# Patient Record
Sex: Male | Born: 1935 | Race: White | Hispanic: No | Smoking: Never smoker
Health system: Southern US, Community
[De-identification: ages and names within clinical notes are randomized; demographics above are authoritative.]

## PROBLEM LIST (undated history)

## (undated) DIAGNOSIS — E785 Hyperlipidemia, unspecified: Secondary | ICD-10-CM

## (undated) DIAGNOSIS — J449 Chronic obstructive pulmonary disease, unspecified: Secondary | ICD-10-CM

## (undated) DIAGNOSIS — I10 Essential (primary) hypertension: Secondary | ICD-10-CM

## (undated) DIAGNOSIS — C801 Malignant (primary) neoplasm, unspecified: Secondary | ICD-10-CM

## (undated) DIAGNOSIS — I2510 Atherosclerotic heart disease of native coronary artery without angina pectoris: Secondary | ICD-10-CM

## (undated) HISTORY — PX: CHOLECYSTECTOMY: SHX55

## (undated) HISTORY — PX: TONSILLECTOMY: SUR1361

## (undated) HISTORY — PX: CORONARY ARTERY BYPASS GRAFT: SHX141

## (undated) HISTORY — PX: CORONARY ANGIOPLASTY: SHX604

## (undated) HISTORY — PX: COLONOSCOPY: SHX174

---

## 2004-11-27 ENCOUNTER — Ambulatory Visit: Payer: Self-pay | Admitting: Cardiology

## 2006-07-05 ENCOUNTER — Emergency Department: Payer: Self-pay | Admitting: General Practice

## 2006-10-20 ENCOUNTER — Ambulatory Visit: Payer: Self-pay | Admitting: Urology

## 2006-10-20 ENCOUNTER — Other Ambulatory Visit: Payer: Self-pay

## 2006-11-04 ENCOUNTER — Ambulatory Visit: Payer: Self-pay

## 2007-11-12 ENCOUNTER — Ambulatory Visit: Payer: Self-pay | Admitting: Cardiology

## 2008-11-30 ENCOUNTER — Ambulatory Visit: Payer: Self-pay | Admitting: Cardiology

## 2010-02-21 ENCOUNTER — Ambulatory Visit: Payer: Self-pay | Admitting: Urology

## 2010-02-28 ENCOUNTER — Ambulatory Visit: Payer: Self-pay | Admitting: Urology

## 2010-10-01 ENCOUNTER — Ambulatory Visit: Payer: Self-pay | Admitting: Cardiology

## 2011-12-22 ENCOUNTER — Ambulatory Visit: Payer: Self-pay | Admitting: Internal Medicine

## 2012-01-05 LAB — CBC
HGB: 17.8 g/dL (ref 13.0–18.0)
MCH: 32.2 pg (ref 26.0–34.0)
MCV: 97 fL (ref 80–100)
RBC: 5.53 10*6/uL (ref 4.40–5.90)
WBC: 10.3 10*3/uL (ref 3.8–10.6)

## 2012-01-05 LAB — COMPREHENSIVE METABOLIC PANEL
Alkaline Phosphatase: 76 U/L (ref 50–136)
BUN: 19 mg/dL — ABNORMAL HIGH (ref 7–18)
Calcium, Total: 9.2 mg/dL (ref 8.5–10.1)
Co2: 26 mmol/L (ref 21–32)
EGFR (African American): >60
EGFR (Non-African Amer.): 58 — ABNORMAL LOW
Glucose: 108 mg/dL — ABNORMAL HIGH (ref 65–99)
Osmolality: 290 (ref 275–301)
Potassium: 3.6 mmol/L (ref 3.5–5.1)
SGPT (ALT): 33 U/L
Sodium: 144 mmol/L (ref 136–145)
Total Protein: 7.8 g/dL (ref 6.4–8.2)

## 2012-01-05 LAB — LIPASE, BLOOD: Lipase: >3000 U/L (ref 73–393)

## 2012-01-05 LAB — CK TOTAL AND CKMB (NOT AT ARMC)
CK, Total: 263 U/L — ABNORMAL HIGH (ref 35–232)
CK-MB: 1.8 ng/mL (ref 0.5–3.6)

## 2012-01-05 LAB — LACTATE DEHYDROGENASE: LDH: 337 U/L — ABNORMAL HIGH (ref 87–241)

## 2012-01-05 LAB — TROPONIN I: Troponin-I: 0.02 ng/mL

## 2012-01-06 ENCOUNTER — Inpatient Hospital Stay: Payer: Self-pay | Admitting: Internal Medicine

## 2012-01-06 LAB — BASIC METABOLIC PANEL
Anion Gap: 9 (ref 7–16)
BUN: 22 mg/dL — ABNORMAL HIGH (ref 7–18)
Chloride: 110 mmol/L — ABNORMAL HIGH (ref 98–107)
Co2: 25 mmol/L (ref 21–32)
EGFR (African American): 59 — ABNORMAL LOW
Glucose: 161 mg/dL — ABNORMAL HIGH (ref 65–99)
Osmolality: 294 (ref 275–301)
Potassium: 4.3 mmol/L (ref 3.5–5.1)
Sodium: 144 mmol/L (ref 136–145)

## 2012-01-06 LAB — CBC WITH DIFFERENTIAL/PLATELET
Eosinophil %: 0.1 %
HGB: 15.6 g/dL (ref 13.0–18.0)
Lymphocyte #: 0.8 10*3/uL — ABNORMAL LOW (ref 1.0–3.6)
Lymphocyte %: 6.9 %
MCV: 98 fL (ref 80–100)
Monocyte %: 6 %
Neutrophil #: 10.4 10*3/uL — ABNORMAL HIGH (ref 1.4–6.5)
Platelet: 120 10*3/uL — ABNORMAL LOW (ref 150–440)
RDW: 13.1 % (ref 11.5–14.5)

## 2012-01-06 LAB — HEPATIC FUNCTION PANEL A (ARMC)
Albumin: 3.2 g/dL — ABNORMAL LOW (ref 3.4–5.0)
Alkaline Phosphatase: 53 U/L (ref 50–136)
SGOT(AST): 26 U/L (ref 15–37)
Total Protein: 6.1 g/dL — ABNORMAL LOW (ref 6.4–8.2)

## 2012-01-06 LAB — TRIGLYCERIDES: Triglycerides: 225 mg/dL — ABNORMAL HIGH (ref 0–200)

## 2012-01-06 LAB — LIPID PANEL
Cholesterol: 168 mg/dL (ref 0–200)
HDL Cholesterol: 32 mg/dL — ABNORMAL LOW (ref 40–60)
Ldl Cholesterol, Calc: 115 mg/dL — ABNORMAL HIGH (ref 0–100)
VLDL Cholesterol, Calc: 21 mg/dL (ref 5–40)

## 2012-01-06 LAB — LIPASE, BLOOD: Lipase: >3000 U/L (ref 73–393)

## 2012-01-07 LAB — CBC WITH DIFFERENTIAL/PLATELET
Basophil #: 0 10*3/uL (ref 0.0–0.1)
Basophil %: 0.2 %
Eosinophil #: 0 10*3/uL (ref 0.0–0.7)
Eosinophil %: 0.1 %
HCT: 44.9 % (ref 40.0–52.0)
HGB: 14.5 g/dL (ref 13.0–18.0)
Lymphocyte #: 1 10*3/uL (ref 1.0–3.6)
Lymphocyte %: 7.3 %
MCV: 98 fL (ref 80–100)
Monocyte #: 0.8 x10 3/mm (ref 0.2–1.0)
Monocyte %: 6.2 %
Neutrophil #: 11.2 10*3/uL — ABNORMAL HIGH (ref 1.4–6.5)
WBC: 13 10*3/uL — ABNORMAL HIGH (ref 3.8–10.6)

## 2012-01-07 LAB — URINALYSIS, COMPLETE
Leukocyte Esterase: NEGATIVE
RBC,UR: 3 /HPF (ref 0–5)
Specific Gravity: 1.015 (ref 1.003–1.030)
Squamous Epithelial: NONE SEEN
WBC UR: 4 /HPF (ref 0–5)

## 2012-01-07 LAB — COMPREHENSIVE METABOLIC PANEL
Albumin: 2.8 g/dL — ABNORMAL LOW (ref 3.4–5.0)
Alkaline Phosphatase: 49 U/L — ABNORMAL LOW (ref 50–136)
Anion Gap: 9 (ref 7–16)
BUN: 25 mg/dL — ABNORMAL HIGH (ref 7–18)
Co2: 25 mmol/L (ref 21–32)
Creatinine: 1.04 mg/dL (ref 0.60–1.30)
Potassium: 4.6 mmol/L (ref 3.5–5.1)
SGPT (ALT): 23 U/L
Sodium: 146 mmol/L — ABNORMAL HIGH (ref 136–145)
Total Protein: 5.8 g/dL — ABNORMAL LOW (ref 6.4–8.2)

## 2012-01-07 LAB — MAGNESIUM: Magnesium: 1.7 mg/dL — ABNORMAL LOW

## 2012-01-08 LAB — LIPASE, BLOOD: Lipase: 101 U/L (ref 73–393)

## 2012-01-09 LAB — BASIC METABOLIC PANEL
Anion Gap: 9 (ref 7–16)
Calcium, Total: 8.4 mg/dL — ABNORMAL LOW (ref 8.5–10.1)
EGFR (African American): >60
EGFR (Non-African Amer.): >60
Glucose: 134 mg/dL — ABNORMAL HIGH (ref 65–99)
Osmolality: 287 (ref 275–301)
Sodium: 141 mmol/L (ref 136–145)

## 2012-01-21 ENCOUNTER — Ambulatory Visit: Payer: Self-pay | Admitting: Internal Medicine

## 2012-03-04 ENCOUNTER — Inpatient Hospital Stay: Payer: Self-pay | Admitting: Internal Medicine

## 2012-03-04 LAB — COMPREHENSIVE METABOLIC PANEL
Anion Gap: 13 (ref 7–16)
Calcium, Total: 9.1 mg/dL (ref 8.5–10.1)
Co2: 24 mmol/L (ref 21–32)
Creatinine: 1.11 mg/dL (ref 0.60–1.30)
EGFR (African American): >60
EGFR (Non-African Amer.): >60
Glucose: 131 mg/dL — ABNORMAL HIGH (ref 65–99)
Osmolality: 293 (ref 275–301)
SGPT (ALT): 44 U/L
Sodium: 145 mmol/L (ref 136–145)
Total Protein: 7.4 g/dL (ref 6.4–8.2)

## 2012-03-04 LAB — URINALYSIS, COMPLETE
Hyaline Cast: 3
Nitrite: NEGATIVE
Ph: 6 (ref 4.5–8.0)
RBC,UR: 51 /HPF (ref 0–5)
WBC UR: 15 /HPF (ref 0–5)

## 2012-03-04 LAB — CBC
HGB: 17.5 g/dL (ref 13.0–18.0)
MCH: 32.1 pg (ref 26.0–34.0)
MCV: 97 fL (ref 80–100)
Platelet: 175 10*3/uL (ref 150–440)
RBC: 5.44 10*6/uL (ref 4.40–5.90)
RDW: 13.8 % (ref 11.5–14.5)
WBC: 17.9 10*3/uL — ABNORMAL HIGH (ref 3.8–10.6)

## 2012-03-04 LAB — LIPASE, BLOOD: Lipase: >3000 U/L (ref 73–393)

## 2012-03-04 LAB — CK TOTAL AND CKMB (NOT AT ARMC)
CK, Total: 172 U/L (ref 35–232)
CK-MB: 1.1 ng/mL (ref 0.5–3.6)

## 2012-03-04 LAB — MAGNESIUM: Magnesium: 1.9 mg/dL

## 2012-03-05 LAB — CBC WITH DIFFERENTIAL/PLATELET
Basophil #: 0.1 10*3/uL (ref 0.0–0.1)
Basophil %: 0.8 %
HCT: 37.8 % — ABNORMAL LOW (ref 40.0–52.0)
MCH: 31.8 pg (ref 26.0–34.0)
MCHC: 32.7 g/dL (ref 32.0–36.0)
MCV: 97 fL (ref 80–100)
Monocyte #: 0.9 x10 3/mm (ref 0.2–1.0)
RBC: 3.89 10*6/uL — ABNORMAL LOW (ref 4.40–5.90)
RDW: 13.7 % (ref 11.5–14.5)

## 2012-03-05 LAB — COMPREHENSIVE METABOLIC PANEL
Albumin: 2.7 g/dL — ABNORMAL LOW (ref 3.4–5.0)
Anion Gap: 7 (ref 7–16)
BUN: 16 mg/dL (ref 7–18)
Bilirubin,Total: 0.8 mg/dL (ref 0.2–1.0)
Calcium, Total: 7.9 mg/dL — ABNORMAL LOW (ref 8.5–10.1)
EGFR (African American): >60
EGFR (Non-African Amer.): >60
Glucose: 84 mg/dL (ref 65–99)
Potassium: 4 mmol/L (ref 3.5–5.1)
SGPT (ALT): 24 U/L

## 2012-03-05 LAB — LIPID PANEL
Cholesterol: 116 mg/dL (ref 0–200)
HDL Cholesterol: 29 mg/dL — ABNORMAL LOW (ref 40–60)
Triglycerides: 116 mg/dL (ref 0–200)
VLDL Cholesterol, Calc: 23 mg/dL (ref 5–40)

## 2012-03-05 LAB — LIPASE, BLOOD: Lipase: 276 U/L (ref 73–393)

## 2012-03-09 LAB — CULTURE, BLOOD (SINGLE)

## 2012-03-23 ENCOUNTER — Ambulatory Visit: Payer: Self-pay | Admitting: Surgery

## 2012-03-23 LAB — CBC WITH DIFFERENTIAL/PLATELET
Basophil %: 0.8 %
Eosinophil #: 0.2 10*3/uL (ref 0.0–0.7)
Eosinophil %: 3.1 %
HCT: 45.5 % (ref 40.0–52.0)
HGB: 15.1 g/dL (ref 13.0–18.0)
Lymphocyte %: 30.2 %
MCHC: 33.3 g/dL (ref 32.0–36.0)
MCV: 95 fL (ref 80–100)
Monocyte %: 10 %
Neutrophil #: 4.1 10*3/uL (ref 1.4–6.5)
RBC: 4.77 10*6/uL (ref 4.40–5.90)
WBC: 7.3 10*3/uL (ref 3.8–10.6)

## 2012-03-23 LAB — BASIC METABOLIC PANEL
Anion Gap: 6 — ABNORMAL LOW (ref 7–16)
BUN: 13 mg/dL (ref 7–18)
Co2: 29 mmol/L (ref 21–32)
Creatinine: 1.05 mg/dL (ref 0.60–1.30)
EGFR (African American): >60
EGFR (Non-African Amer.): >60
Potassium: 3.7 mmol/L (ref 3.5–5.1)
Sodium: 141 mmol/L (ref 136–145)

## 2012-03-23 LAB — HEPATIC FUNCTION PANEL A (ARMC)
Albumin: 3.6 g/dL (ref 3.4–5.0)
Alkaline Phosphatase: 78 U/L (ref 50–136)
Bilirubin, Direct: 0.1 mg/dL (ref 0.00–0.20)
SGOT(AST): 21 U/L (ref 15–37)
SGPT (ALT): 16 U/L
Total Protein: 7.2 g/dL (ref 6.4–8.2)

## 2012-03-23 LAB — LIPASE, BLOOD: Lipase: 107 U/L (ref 73–393)

## 2012-04-05 ENCOUNTER — Ambulatory Visit: Payer: Self-pay | Admitting: Surgery

## 2012-07-02 ENCOUNTER — Ambulatory Visit: Payer: Self-pay | Admitting: Cardiology

## 2013-05-12 ENCOUNTER — Ambulatory Visit: Payer: Self-pay | Admitting: Specialist

## 2013-06-21 ENCOUNTER — Ambulatory Visit: Payer: Self-pay | Admitting: Urology

## 2013-09-13 ENCOUNTER — Ambulatory Visit: Payer: Self-pay | Admitting: Specialist

## 2014-11-08 ENCOUNTER — Ambulatory Visit: Payer: Self-pay | Admitting: Vascular Surgery

## 2014-11-20 ENCOUNTER — Inpatient Hospital Stay: Payer: Self-pay | Admitting: Internal Medicine

## 2015-01-14 NOTE — Consult Note (Signed)
Chief Complaint:   Subjective/Chief Complaint doing well, 3/10 discomfort wants to eat. passing flatus   VITAL SIGNS/ANCILLARY NOTES: **Vital Signs.:   17-Apr-13 05:08   Vital Signs Type Routine   Temperature Temperature (F) 98.7   Celsius 37   Temperature Source oral   Pulse Pulse 74   Pulse source per Dinamap   Respirations Respirations 19   Systolic BP Systolic BP 419   Diastolic BP (mmHg) Diastolic BP (mmHg) 80   Mean BP 98   BP Source Dinamap   Pulse Ox % Pulse Ox % 93   Pulse Ox Activity Level  At rest   Oxygen Delivery 2L   Brief Assessment:   Cardiac Regular    Respiratory clear BS    Gastrointestinal details normal Soft  mild epigastric discomfort, bs positive   Routine UA:  17-Apr-13 03:53    Color (UA) Yellow   Clarity (UA) Hazy   Glucose (UA) Negative   Bilirubin (UA) Negative   Ketones (UA) Trace   Specific Gravity (UA) 1.015   Blood (UA) 1+   pH (UA) 6.0   Protein (UA) 30 mg/dL   Nitrite (UA) Negative   Leukocyte Esterase (UA) Negative   RBC (UA) 3 /HPF   WBC (UA) 4 /HPF   Bacteria (UA) TRACE   Mucous (UA) PRESENT  Routine Hem:  17-Apr-13 05:09    WBC (CBC) 13.0   RBC (CBC) 4.56   Hemoglobin (CBC) 14.5   Hematocrit (CBC) 44.9   Platelet Count (CBC) 103   MCV 98   MCH 31.9   MCHC 32.4   RDW 13.3  Routine Chem:  17-Apr-13 05:09    Glucose, Serum 87   BUN 25   Creatinine (comp) 1.04   Sodium, Serum 146   Potassium, Serum 4.6   Chloride, Serum 112   CO2, Serum 25   Calcium (Total), Serum 7.9  Hepatic:  17-Apr-13 05:09    Bilirubin, Total 1.9   Alkaline Phosphatase 49   SGPT (ALT) 23   SGOT (AST) 30   Total Protein, Serum 5.8   Albumin, Serum 2.8  Routine Chem:  17-Apr-13 05:09    Osmolality (calc) 294   eGFR (African American) >60   eGFR (Non-African American) >60   Anion Gap 9   Lipase 801  Routine Hem:  17-Apr-13 05:09    Neutrophil % 86.2   Lymphocyte % 7.3   Monocyte % 6.2   Eosinophil % 0.1   Basophil % 0.2    Neutrophil # 11.2   Lymphocyte # 1.0   Monocyte # 0.8   Eosinophil # 0.0   Basophil # 0.0  Routine Chem:  17-Apr-13 05:09    Magnesium, Serum 1.7   Radiology Results: MRI:    17-Apr-13 15:51, MRCP MR Cholangiogram   MRCP MR Cholangiogram    REASON FOR EXAM:    acute biliary pancreatitis, possible ductal problems  COMMENTS:       PROCEDURE: MR  - MR CHOLANGIOGRAM  - Jan 07 2012  3:51PM     RESULT:     HISTORY: Pancreatitis.    COMPARISON STUDIES:  CT abdomen of 01/06/2012.     PROCEDURE AND FINDINGS:   Standard M.R.C.P. performed. The biliary system   is widely patent. No evidence of prominent ductal dilatation. The   pancreatic duct is normal. No obstructing lesions are noted. The   gallbladder is nondistended. Multiple liver cysts are noted. Multiple     renal cysts are present. The pancreas is normal.  Basilar atelectasis   versus infiltrate is noted.    IMPRESSION:  No focal pancreatic or biliary abnormality identified.      Thank you for this opportunity to contribute to the care of your patient.           Verified By: Osa Craver, M.D., MD   Assessment/Plan:  Assessment/Plan:   Assessment 1) acute pancreatitis, uncertain etiology.  MRCP today negative for stones or ductal abnormality.  overall inproved, passing flatus, much less pain.    Plan 1) will trial clears/non-carbonated.  continue current.   Electronic Signatures: Loistine Simas (MD)  (Signed 17-Apr-13 19:31)  Authored: Chief Complaint, VITAL SIGNS/ANCILLARY NOTES, Brief Assessment, Lab Results, Radiology Results, Assessment/Plan   Last Updated: 17-Apr-13 19:31 by Loistine Simas (MD)

## 2015-01-14 NOTE — Discharge Summary (Signed)
PATIENT NAME:  Alan Ortega, Alan Ortega MR#:  [id number] DATE OF BIRTH:  10/28/1933  DATE OF ADMISSION:  03/04/2012 DATE OF DISCHARGE:  03/05/2012  PRESENTING COMPLAINT: Abdominal pain.   DISCHARGE DIAGNOSES:  1. Acute gallstone-induced pancreatitis.  2. Chronic obstructive pulmonary disease.   CONDITION ON DISCHARGE: Fair. Vitals stable.   MEDICATIONS:  1. Simvastatin 40 mg p.o. daily.  2. Symbicort 160/4.5, 2 puffs b.i.d.  3. Lexapro 10 mg daily. 4. Imdur extended release 30 mg daily.  5. Albuterol ipratropium 2 puffs every six hours as needed.  6. Dexilant 60 mg p.o., delayed release capsule daily.   DIET: Low sodium.   FOLLOWUP: 1. Follow up with Dr. Leanora Cover 06/27 at 10:30 a.m.  2. Follow up with primary care physician in 1 to 2 weeks.  LABORATORY DATA: Hemoglobin and hematocrit 12.4 and 37.8, platelet count 124, white count 7.4. Comprehensive metabolic panel within normal limits. Calcium 7.9. Albumin 2.7. Magnesium 1.7. Lipid profile within normal limits. HDL 29. Lipase is 276. Blood cultures negative in 24 hours. Ultrasound of the abdomen shows multiple gallstones noted. Gallbladder wall is thickened suggestive of cholecystitis. There is no reported sonographic Murphy's sign. SGOT 98 on admission along with white count 17.9. Lipase was more than 3000. Urinalysis negative for urinary tract infection.  CONSULTS: Surgical consultations with Dr. Leanora Cover and Dr. Burt Knack.   BRIEF SUMMARY OF HOSPITAL COURSE: Mr. Hallinan is a pleasant 81 year old Caucasian gentleman who was admitted with:  1. Gallstone-induced pancreatitis. The patient had gallstones which were noted on ultrasound of the abdomen. He was kept n.p.o.  He was started on IV fluids and empiric IV antibiotics with Levaquin. His symptoms improved. Lipase is down from 3000 to 276. The patient was feeling hungry. He did not have any abdominal pain. He tolerated a p.o. soft diet. He was seen by Dr. Burt Knack and Dr. Leanora Cover from surgery and  will be seen as an outpatient for elective laparoscopic cholecystectomy. Pain was well controlled prior to discharge.  2. Chronic obstructive pulmonary disease. No exacerbation. He continued on his inhalers.  3. Gastroesophageal reflux disease. Continued Protonix.  4. Depression, on Lexapro.  5. Hypertension, on Imdur.  6. Hyperlipidemia. Continued on simvastatin.  7. Overall hospital stay remained stable. The patient remained afebrile. White count was normalized and he did not have any abdominal pain. Hence antibiotics were discontinued.   The discharge plan was discussed with the patient's daughter at length who was agreeable to it.  Hospital stay otherwise remained stable.   CODE STATUS: The patient remained a FULL CODE.   TIME SPENT: 40 minutes.    ____________________________ Hart Rochester Posey Pronto, MD sap:bjt D: 03/05/2012 17:06:40 ET T: 03/08/2012 09:28:16 ET JOB#: [id number]  cc: Nhia Heaphy A. Posey Pronto, MD, <Dictator> Richard E. Burt Knack, MD Consuela Mimes, MD Milinda Pointer Jacqualine Code, MD Ilda Basset MD ELECTRONICALLY SIGNED 03/13/2012 15:43

## 2015-01-14 NOTE — Consult Note (Signed)
Brief Consult Note: Diagnosis: recurrent pancreatits.   Patient was seen by consultant.   Consult note dictated.   Recommend further assessment or treatment.   Comments: pain and nausea completely resolved. No sign of biliary source at this time. No surgical plans.  Electronic Signatures: Florene Glen (MD)  (Signed 13-Jun-13 21:25)  Authored: Brief Consult Note   Last Updated: 13-Jun-13 21:25 by Florene Glen (MD)

## 2015-01-14 NOTE — Consult Note (Signed)
Chief Complaint:   Subjective/Chief Complaint Please see full GI consult.  Patietn seen and examined.  Patient admitted woth abdominal pain, evidence of pancreatitis by ct and labs.  Pain currently improved, with 5/10 compared to yesterday.   Etiology uncertain, however likely is biliary in nature.  Continue current with ivf, pain control, npo for now.  May need to have MRCP to rule out ductal  problems if other testing is negative.  Following.   VITAL SIGNS/ANCILLARY NOTES: **Vital Signs.:   16-Apr-13 14:38   Vital Signs Type Routine   Temperature Temperature (F) 98.1   Celsius 36.7   Temperature Source oral   Pulse Pulse 89   Pulse source per Dinamap   Respirations Respirations 20   Systolic BP Systolic BP 561   Diastolic BP (mmHg) Diastolic BP (mmHg) 79   Mean BP 97   BP Source Dinamap   Pulse Ox % Pulse Ox % 91   Pulse Ox Activity Level  At rest   Oxygen Delivery 2L   Electronic Signatures: Loistine Simas (MD)  (Signed 16-Apr-13 20:28)  Authored: Chief Complaint, VITAL SIGNS/ANCILLARY NOTES   Last Updated: 16-Apr-13 20:28 by Loistine Simas (MD)

## 2015-01-14 NOTE — Op Note (Signed)
PATIENT NAME:  Alan Ortega, Alan Ortega MR#:  [id number] DATE OF BIRTH:  26-Dec-1933  DATE OF PROCEDURE:  04/05/2012  PREOPERATIVE DIAGNOSIS: Symptomatic cholelithiasis (history of gallstone pancreatitis).   POSTOPERATIVE DIAGNOSIS: Symptomatic cholelithiasis (history of gallstone pancreatitis).  PROCEDURE PERFORMED: Laparoscopic cholecystectomy with intraoperative cholangiogram.   SURGEON: Consuela Mimes, M.D.   ANESTHESIA: General.   PROCEDURE IN DETAIL: The patient was placed supine on the operating room table and prepped and draped in the usual sterile fashion. A 15 mmHg CO2 pneumoperitoneum was created via a Veress needle in the infraumbilical position and this was replaced with a 5 mm trocar and a 30 degree angled laparoscope. The pneumoperitoneum pressure was dropped to 12 mm due to the patient's chronic obstructive pulmonary disease and heart disease, although he remained stable throughout the procedure. Three additional trocars were placed under direct visualization. The fundus of the gallbladder was retracted superiorly and ventrally and there were multiple adhesions to the visceral surface of the gallbladder and these were taken down with electrocautery and bluntly. There were also adhesions to the visceral surface of the liver and these were taken down with electrocautery and sharply. Dissection was undertaken about halfway up the gallbladder wall between the cystic artery and the gallbladder where a lymph node was present and the cystic artery was triply clipped on the patient's side and doubly clipped on the gallbladder side and divided. Dissection then revealed a posterior cystic artery which was also triply clipped on the patient's side and doubly clipped on the gallbladder side and divided. The cystic duct infundibular junction was identified, a single clip was placed here, a ductotomy was performed, and a Reddick catheter was used with fluoroscopy to perform an intraoperative cholangiogram.  There were no filling defects and there was free flow of contrast into the duodenum and all of the intrahepatic radicles filled with contrast normally. Therefore, the cholangiogram catheter was removed and the cystic duct stump was doubly or triply clipped and the cystic ductotomy was completed and the gallbladder was removed from the liver bed with the electrocautery. It was placed in an Endo Catch bag and extracted from the abdomen via the epigastric port. This port site fascia was closed with a single 0 Vicryl suture using the laparoscopic puncture closure device. The right upper quadrant was irrigated with copious amounts of warm normal saline and this was suctioned clear. The clips were secure. There was no bile leak and hemostasis was excellent. Therefore, the peritoneum was desufflated and decannulated and all four skin sites were closed with subcuticular 5-0 Monocryl and suture strips. The patient tolerated the procedure well. There were no complications.  ____________________________ Consuela Mimes, MD wfm:slb D: 04/05/2012 09:14:46 ET T: 04/05/2012 11:02:24 ET JOB#: [id number]  cc: Consuela Mimes, MD, <Dictator> Consuela Mimes MD ELECTRONICALLY SIGNED 04/05/2012 14:08

## 2015-01-14 NOTE — H&P (Signed)
PATIENT NAME:  Alan Ortega, Alan Ortega MR#:  [id number] DATE OF BIRTH:  10/08/1933  DATE OF ADMISSION:  01/06/2012  REFERRING PHYSICIAN: Dr. Jasmine December   PRIMARY MEDICAL PHYSICIAN: Dr. Jacqualine Code   CHIEF COMPLAINT: Abdominal pain.   HISTORY OF PRESENT ILLNESS: Mr. Nuttall is a 81 year old male who lives at home by himself with significant past medical history of hypertension, coronary artery disease, chronic obstructive pulmonary disease, and hypercholesterolemia who presents with complaint of abdominal pain. The patient reports the pain started at 6:30 this p.m. No provoking or relieving factors. Pain has been constant. Pain was accompanied by nausea and multiple episodes of vomiting yellow color, loss of appetite even though denies any constipation, diarrhea, chest pain or any radiation of the pain. The patient had basic work-up done in the ED where he had elevated lipase a little more than 3000. The patient had a right upper quadrant ultrasound done which did not show evidence of cholecystitis, cholelithiasis, or gallstones. The patient was giving IV Zofran and IV morphine in the ED where his pain has significantly subsided since. The patient denies any alcohol abuse or any recent alcohol use.   PAST MEDICAL HISTORY: 1. Hypercholesterolemia.  2. Hypertension.  3. Prostate cancer.  4. Coronary artery disease.  5. Chronic obstructive pulmonary disease. 6. Depression and anxiety.   PAST SURGICAL HISTORY:  1. Prostatectomy.  2. CABG.   MEDICATIONS: 1. Aspirin 81 mg daily.  2. Lisinopril 20 mg oral daily.  3. Simvastatin 80 mg half-tablet oral daily.  4. Citalopram 10 mg daily. 5. Lansoprazole 60 mg daily. 6. Lorazepam 0.5 to 1 mg per mouth at bedtime.   ALLERGIES: No known drug allergies.   FAMILY HISTORY: No family history of colon cancer or colonic polyps.   SOCIAL HISTORY: The patient lives at home by himself. Wife died early this year. Remote tobacco abuse. No alcohol use. Has daughter who  lives in [place]. Retired.   REVIEW OF SYSTEMS: Denies any fever. Complains of generalized weakness. EYES: Denies any blurry vision, double vision, pain. ENT: Denies any tinnitus, ear pain, hearing loss. RESPIRATORY: Denies any cough, wheezing, hemoptysis, dyspnea. CARDIOVASCULAR: Denies any chest pain, orthopnea, edema, arrhythmia, palpitation. GI: Has complaints of nausea, vomiting, abdominal pain. Denies any diarrhea or constipation. GU: Denies any dysuria, hematuria, renal colic. ENDOCRINE: Denies any polyuria, polydipsia, heat or cold intolerance. HEMATOLOGY: Denies any easy bruising, bleeding, diathesis, blood clots. NEUROLOGIC: Denies any numbness, weakness, dysarthria, epilepsy, tremors. PSYCH: Has situational depression after his wife passed away recently. Denies any alcohol or drug abuse.   PHYSICAL EXAMINATION:   VITAL SIGNS: Temperature 97.2, pulse 76, respiratory rate 15, blood pressure 117/68, pulse oximetry 95% on oxygen.   GENERAL: Elderly male who is comfortable in bed in no apparent distress.   HEENT: Head atraumatic, normocephalic. Pupils equal, reactive to light. Pink conjunctivae. Anicteric sclerae. Moist oral mucosa.   NECK: Supple. No thyromegaly. No JVD.   CHEST: Good air entry bilaterally. No wheezing or rhonchi.   CARDIOVASCULAR: S1, S2. No rubs, murmurs, gallops.   ABDOMEN: Mild epigastric tenderness. No rebound. No guarding. Soft. Good bowel sounds.   EXTREMITIES: No edema, no clubbing, no cyanosis.   SKIN: No rash or ulcers.   MOTOR: Moves all extremities 5 out of 5 motor strength.   NEUROLOGIC: Cranial nerves grossly intact.  PSYCHIATRIC: Appropriate affect. Awake, alert x3. Intact judgment and insight.   PERTINENT LABS: Sodium 144, potassium 3.6, chloride 107, CO2 26, calcium 9.2, total bilirubin 0.6, alkaline phosphatase 76, ALT 33,  AST 49, total protein 7.8, albumin 4.1. Lipase more than 3000. LDH 337.  Ultrasound abdomen showing mild to moderate  right side hydronephrosis with right renal atrophy. Some calcification appears to be present in right kidney. No evidence of cholelithiasis or evidence of acute cholecystitis. Pancreas is not visualized because of overlying bowel gas.   ASSESSMENT AND PLAN:  1. Acute pancreatitis. The patient denies any alcohol abuse and there is no evidence of any gallstones or cholelithiasis. Will check lipid profile and lipid panel in a.m. Will keep patient n.p.o. Will start him on IV fluids, IV normal saline with 20 of potassium at 100 mL per hour. Will have him on morphine for pain control and Zofran for his nausea. Will keep him n.p.o. until his lipase improves and until his abdominal pain resolves. Will consult GI service in a.m.  2. History of coronary artery disease. Will hold aspirin at this point and will resume once patient is more stable.  3. Hypertension, controlled at this point. Will resume lisinopril when patient is more stable.  4. Hyperlipidemia. Will check lipid profile and will resume statin when pancreatitis improves.   5. DVT prophylaxis. Sub-Q heparin every eight hours.  CODE STATUS: The patient is FULL CODE.        TOTAL TIME SPENT ON PATIENT CARE: 55 minutes.    ____________________________ Albertine Patricia, MD dse:drc D: 01/06/2012 03:56:01 ET T: 01/06/2012 08:23:56 ET JOB#: [id number]  cc: Albertine Patricia, MD, <Dictator> Milinda Pointer. Jacqualine Code, MD Allyssa Abruzzese Graciela Husbands MD ELECTRONICALLY SIGNED 01/08/2012 22:03

## 2015-01-14 NOTE — Consult Note (Signed)
PATIENT NAME:  Alan Ortega, Alan Ortega MR#:  [id number] DATE OF BIRTH:  Oct 20, 1933  DATE OF CONSULTATION:  01/06/2012  REFERRING PHYSICIAN:   CONSULTING PHYSICIAN:  Alan Demark, NP  PRIMARY CARE PHYSICIAN: Alan Dus, MD  HISTORY OF PRESENT ILLNESS: Alan Ortega is a pleasant 81 year old Caucasian gentleman with history of prostate cancer/prostatectomy, chronic obstructive pulmonary disease, coronary artery disease with CABG, borderline diabetes, hyperlipidemia, hypertension, depression, and anxiety with abdominal pain, nausea, and vomiting. Gastroenterology has been consulted by Alan Ortega for acute pancreatitis. In chart review, the patient was seen last month in the GI Clinic for abdominal pain and nausea that started after his wife died in October 23, 2022. At that time, he was started on Dexilant 60 mg p.o. daily. It has been very effective until yesterday. He states that he had sudden onset of epigastric pain, nausea, and vomiting. He attributes this to some pound cake he ate earlier in the day. He states that it felt like something was hung down near his stomach. He drank some water, but it did not help. He denies trouble swallowing the pound cake. He had several episodes of yellow emesis. No fevers, constitutional symptoms, or other GI-related complaints. Nausea and vomiting are better today. He denies diarrhea, bloody or black tarry stools, bowel habit changes, constipation, problems swallowing, or loss of weight. He states he is hungry at present. He did undergo colonoscopy in 2004 and was incomplete to the proximal transverse colon. What was examined did appear normal. His triglycerides were checked last month in the clinic. They were in the low 100s. He has no history of sulfa drugs, azathioprine, alcohol, gallstones, or prior pancreatitis. His last A1c was 6.2.   PAST MEDICAL HISTORY:  1. Hyperlipidemia. 2. Hypertension. 3. Prostate cancer, sees Alan Ortega. 4. Coronary artery disease,  sees Alan Ortega. 5. COPD.  6. Depression and anxiety.  7. Borderline diabetes.  PAST SURGICAL HISTORY:  1. Prostatectomy.  2. Coronary artery bypass graft.   HOME MEDICATIONS:  1. ASA 81 mg p.o. daily. 2. Lisinopril 20 mg p.o. daily. 3. Simvastatin 40 mg p.o. daily. 4. Citalopram 10 mg p.o. daily. 5. Dexilant 60 mg p.o. daily. 6. Lorazepam 0.5 to 1 mg per mouth at bedtime p.r.n.   ALLERGIES: Reports an allergy to erythromycin.  FAMILY HISTORY: Denies history of colorectal cancer, liver disease, colon polyps, pancreatic disease, or peptic ulcer disease.   SOCIAL HISTORY: He lives at home by himself. His wife died earlier this year as noted. Quit smoking over 20 years ago. Denies alcohol and illicits. His daughter lives in [place].  REVIEW OF SYSTEMS: GENERAL: Does have some weakness at present, but no fevers or other constitutional symptoms. HEENT: Does have some hearing loss, but no visual changes, blurry vision, pain, headaches, tinnitus, problems swallowing, or sore throat. RESPIRATORY: History significant for chronic obstructive pulmonary disease. Does feel short of breath most days, but has had no recent changes. States his current illness is not affecting his breathing. He has seen Alan Ortega in the past for chronic obstructive pulmonary disease evaluation. He does not wear oxygen at home. CARDIOVASCULAR: Does have history of CABG, followed by cardiologist. No complaints of chest pain, orthopnea, or edema. Has had sinus arrhythmia on EKG in the past. No complaints of palpitations. GI: As noted above. GENITOURINARY: Denies dysuria, hematuria, or flank pain. ENDOCRINE: Last A1c in clinic was 6.2. No polyuria, polydipsia, or hot or cold intolerances. No history of thyroid disease. HEMATOLOGY: No history of his anemia, bruising,  bleeding, or blood clots. NEUROLOGIC: No history of CVA, numbness, tingling, seizures, fainting, or dizziness. PSYCH: History significant for depression  and anxiety after recent loss of wife. Does have an appointment with Alan Ortega scheduled on 01/13/2012 and has recently been started as an outpatient on Lexapro. No history of alcohol or illicit drug abuse.   LABS/STUDIES: Most recent lab work: Glucose 161, BUN 22, creatinine 1.35, sodium 144, potassium 4.3, chloride 110, CO2 25. GFR 51. LDL 115, triglycerides 107, cholesterol 168, HDL 32, LDH 337. Lipase greater than 3000. Protein 6.1, albumin 3.2, total bilirubin 0.7, direct bilirubin 0.2, alkaline phosphatase 59, AST 26, ALT 26. CK 263. CPK-MB normal. Troponin normal. WBC 12.1, hemoglobin 15.6, hematocrit 46.7, and platelets 120. Red cells are normocytic with increased RDW. Neutrophil count with mild elevation at 10.4.  Two-view abdomen x-ray demonstrated staghorn calcification in one kidney, left-sided atelectasis, and coronary artery bypass graft changes.   Chest x-ray demonstrated bullous emphysema, more on the left than the right.  Abdominal ultrasound was unable to visualize the pancreas secondary to bowel gas. Common bile duct was not dilated. Gallbladder wall was not thick. There was no ascites, hydronephrosis, or indications of cholelithiasis or gallbladder disease.   CT scan is pending.   PHYSICAL EXAMINATION:   VITAL SIGNS: Most recent vital signs: Temperature 98.1, pulse 80, respiratory rate 18, blood pressure 148/86, and oxygen saturation 93% on 2 liters.   GENERAL: Elderly Caucasian male lying in bed in no acute distress.   PSYCH: Pleasant, stable, cooperative, logical thought.   HEENT: Normocephalic, atraumatic. Eyes well set. No redness, drainage, or inflammation to the eyes or the nares. Oral mucous membranes are pink and moist. No icterus.   NECK: Supple. No thyromegaly, lymphadenopathy, or JVD.   RESPIRATORY: Respirations slightly shallow but regular and effortless. Lungs are slightly diminished, otherwise clear to auscultation bilaterally.   CARDIOVASCULAR: S1 and  S2, regular rate and rhythm. No MRG. Peripheral pulses 2+ bilaterally. No appreciable edema.   ABDOMEN: Protuberant abdomen. Bowel sounds x4. Epigastric tenderness noted. No rebound, peritoneal signs, or guarding. Abdomen is very soft. No hepatosplenomegaly, hernias, or bruits.   RECTAL: Deferred.   GENITOURINARY: Deferred.   EXTREMITIES: Moves all extremities well x4. Sensation intact. No clubbing or cyanosis.   SKIN: No erythema, lesion, or rash.   NEUROLOGIC: Alert and oriented x3. Cranial nerves II through XII grossly intact. Speech clear. No facial droop.   IMPRESSION AND PLAN: Pancreatitis of uncertain origin. We will assess IgG4 and ANA. Recommend continuing with IV proton pump inhibitor therapy, fluid replacement and pain and nausea control. CT is pending. In talking with the patient, he stated that he did not want endoscopic procedures. I did discuss this patient in full with Dr. Gustavo Lah.   These services were provided by Stephens November, MSN, [place] in collaboration with Loistine Simas, MD.  ____________________________ Alan Demark, NP chl:slb D: 01/06/2012 15:05:10 ET T: 01/06/2012 15:23:22 ET JOB#: [id number]  cc: Alan Demark, NP, <Dictator> [place] SIGNED 01/14/2012 7:09

## 2015-01-14 NOTE — Discharge Summary (Signed)
PATIENT NAME:  Alan Ortega, Alan Ortega MR#:  [id number] DATE OF BIRTH:  [birth year]  DATE OF ADMISSION:  01/06/2012 DATE OF DISCHARGE:  01/09/2012  ADMITTING PHYSICIAN: Dr. Waldron Labs   DISCHARGING PHYSICIAN: Gladstone Lighter, MD    PRIMARY MD: Debbora Dus, MD   PRIMARY PSYCHIATRIST: Dr. Weber Cooks   PRIMARY UROLOGIST: Dr. Kennith Maes IN THE HOSPITAL:  1. GI consultation by Dr. Loistine Simas  2. Palliative Care consultation by Dr. Izora Gala Phifer    DISCHARGE DIAGNOSES:  1. Acute pancreatitis, either idiopathic versus gallstone-induced. 2. Chronic obstructive pulmonary disease exacerbation requiring home oxygen.  3. Acute respiratory failure secondary to chronic obstructive pulmonary disease.  4. Right kidney moderate hydronephrosis, probably chronic. 5. Right-sided chronic renal staghorn calculi.  6. Coronary artery disease status post bypass graft surgery.  7. Left lower lobe infiltrate on chest x-ray, likely pneumonia.  8. Hypertension.  9. Depression.  10. Gastroesophageal reflux disease.    DISCHARGE HOME MEDICATIONS:  1. Simvastatin 40 mg p.o. daily.  2. Dexilant 60 mg p.o. daily.  3. Prednisone taper.  4. Levaquin 750 mg p.o. daily until 01/14/2012.  5. Symbicort 160/4.5 two puffs b.i.d.  6. Lexapro 10 mg p.o. daily.  7. Imdur 30 mg p.o. daily.  8. Albuterol inhaler 2 puffs q.6 hours p.r.n.   DISCHARGE HOME OXYGEN: 2 liters.   DISCHARGE DIET: Low sodium diet.   DISCHARGE ACTIVITY: As tolerated.    FOLLOW-UP INSTRUCTIONS:  1. PCP follow-up in 1 to 2 weeks.  2. Home health.   LABS AND IMAGING STUDIES: Sodium 141, potassium 3.6, chloride 107, bicarb 25, BUN 23, creatinine 0.92, glucose 134, calcium 8.4, WBC 13.0, hemoglobin 14.5, hematocrit 44.9, platelet count 103. Lipase normalized to 101. Initial lipase on admission was greater than 3000. LDH was elevated to greater than 337. Triglycerides were 225 on initial presentation. ALT 33, AST 49, alkaline phosphatase  76, total bilirubin 0.6, albumin 4.1. Cardiac enzymes otherwise remained negative.   Chest x-ray showing severe emphysematous bullous lung disease worse on the right side present.   Ultrasound of the abdomen showing mild to moderate right-sided hydronephrosis with right renal atrophy and some calcification present in right kidney. No evidence of cholelithiasis or cholecystitis.   Abdominal ultrasound showing left lung base atelectasis with probable staghorn calculus formation in the right kidney. No definite obstruction is present.   LDL 115, HDL 32, triglycerides 107, total cholesterol 168.   ANA panel has been negative. Immunoglobulin G level is within normal limits.   CT of the abdomen and pelvis without contrast showing coarse calcifications in the right kidney associated with moderate obstructive uropathy. Cortical thinning is present and also changes of pancreatitis are present. No evidence of pseudoaneurysm. No drainable collection is present. Left inguinal hernia. Large right hydrocele is present.   Urinalysis with 1+ blood with very few RBCs are present.   HOSPITAL COURSE: Alan Ortega is a 81 year old elderly male with past medical history significant for coronary artery disease status post bypass graft surgery, chronic right-sided renal calculi, chronic obstructive pulmonary disease, depression, and anxiety who has been pretty depressed secondary to grieving from his wife's sudden death in November 18, 2011 who was brought to the hospital secondary to severe nausea, vomiting, and also abdominal pain. He was found to have acute pancreatitis based on blood work and also his ultrasound.  1. Acute pancreatitis with lipase greater than 3000. He was initially kept n.p.o. IV fluids were given and GI was consulted. His lipase improved for the next couple  of days. He was put on clear liquid diet and was advanced to a regular diet which he was tolerating well. His abdominal pain has improved since then.   2. Right-sided hydronephrosis with moderate obstructive uropathy and also acute renal failure on presentation. The patient has had renal staghorn calculus based on his old x-rays in the past which is chronic. He used to follow-up with Dr. Madelin Headings. We tried to reach Dr. Madelin Headings while the patient was in the hospital but he is not seeing patients in the hospital anymore and the patient requested to follow-up with Dr. Madelin Headings as an outpatient. Since his renal function has improved, he is not in any acute renal failure and this seems chronic. He is being discharged home with outpatient Urology follow-up recommendations.  3. Acute respiratory failure secondary to COPD exacerbation. While in the hospital he was given IV Solu-Medrol dose once and prednisone taper and also there was mild left lower lobe atelectasis versus infiltrate but because of his tenuous breathing situation will place him on Levaquin antibiotic at the time of discharge. The patient could probably have benefited from one more day in the hospital but he was very insistent on going home. If I would have let him leave AMA, I couldn't give him his prescription for home oxygen. He is being sent on home oxygen with home health assistance and also neb treatments as needed.  4. Hypertension. He was on ACE inhibitor which was changed to Imdur while in the hospital secondary to his acute renal failure on presentation.  5. Depression, worsened lately with his wife's unexpected death from cerebral aneurysm in January 2013. He was seen by Dr. Ermalinda Memos for psychospiritual support and he is already on Lexapro which he will continue. The patient has follow-up appointment with Dr. Weber Cooks in the next week as an outpatient. He was strongly advised to keep that.   As mentioned above, he was insistent on going home today, otherwise could have benefited from staying one more day in the hospital mostly for his breathing status. He was given one dose of IV Solu-Medrol and  neb treatment prior to leaving. He did work with physical therapy the day prior and was advised home health.   DISCHARGE CONDITION: Stable.   DISCHARGE DISPOSITION: Home with home health.   TIME SPENT ON DISCHARGE: 45 minutes.   ____________________________ Gladstone Lighter, MD rk:drc D: 01/09/2012 13:55:48 ET T: 01/10/2012 15:07:46 ET JOB#: [id number]  cc: Gladstone Lighter, MD, <Dictator> Milinda Pointer. Jacqualine Code, MD Gonzella Lex, MD Manya Silvas, MD Gladstone Lighter MD ELECTRONICALLY SIGNED 01/15/2012 2:52

## 2015-01-14 NOTE — Consult Note (Signed)
PATIENT NAME:  Alan Ortega, Alan Ortega MR#:  [id number] DATE OF BIRTH:  [birth year]  DATE OF CONSULTATION:  03/04/2012  REFERRING PHYSICIAN:   CONSULTING PHYSICIAN:  Jerrol Banana. Burt Knack, MD  CHIEF COMPLAINT: Chest pain and left upper quadrant pain.  HISTORY OF PRESENT ILLNESS: This is a 81 year old Caucasian male patient. He has had recurrent episodes of pancreatitis; in fact was hospitalized back in April for a similar episode. Last night he drank a full bottle of tomato juice and developed chest pain, reflux acid-like symptoms, vomited and states his pain was mostly in his chest and somewhat in his left upper quadrant. He was in the Emergency Room where an ultrasound suggested possible cholelithiasis, but his lipase was elevated over 3000 with normal AST, ALT. I have been asked to see the patient for consideration of biliary pancreatitis.   Patient was admitted to the hospital six weeks ago or so with a similar episode and a work-up failed to identify a biliary source with negative ultrasound and a negative M.R.C.P.   At this point the patient's nausea has completely resolved, his chest pain has resolved. He has no abdominal pain. Has never had right upper quadrant pain and feels well at this point.   PAST MEDICAL HISTORY:  1. Hyperlipidemia.  2. Hypertension.  3. Prostate cancer. 4. Coronary artery disease. 5. Chronic obstructive pulmonary disease.   PAST SURGICAL HISTORY:  1. Prostatectomy. 2. Coronary artery bypass graft.    MEDICATIONS: Multiple, see chart.   FAMILY HISTORY: Noncontributory.   SOCIAL HISTORY: Patient lives at home. Does not smoke and does not drink alcohol.   REVIEW OF SYSTEMS: 10 system review has been performed and negative with the exception of that mentioned in the history of present illness. He does have hematuria periodically due to known kidney stones on the right.   PHYSICAL EXAMINATION:  GENERAL: Healthy elderly Caucasian male patient.   VITAL SIGNS:  Temperature 98.4, pulse 85, respirations 20, blood pressure 149/75, 90% room air sats.  HEENT: No scleral icterus.   NECK: No palpable neck nodes.   CHEST: Clear to auscultation.   CARDIAC: Regular rate and rhythm.   ABDOMEN: Soft, nondistended, nontender. Scars are noted.   NEUROLOGIC: Grossly intact.   INTEGUMENT: No jaundice.   LABORATORY, DIAGNOSTIC AND RADIOLOGICAL DATA:  An ultrasound suggests gallstones. In the past his ultrasounds have been negative.   White blood cell count 17.9 early this morning, it has not been repeated. Hemoglobin and hematocrit 17 and 53 with platelet count 175, lipase was greater than 3000, normal alkaline phosphatase, AST and ALT although AST is slightly elevated at 98, creatinine 1.1 and BUN 19.   Review of his prior hospitalization failed to identify a biliary source.   ASSESSMENT AND PLAN: Abdominal pain, possible gallstones with discrepancy between studies done this elevation versus last. He had a full GI consult and work-up in the past several weeks ago. While this could be biliary in nature his pain is completely resolved at this point. Labs have not been repeated therefore it is unclear to me if his lipase is decreased or not. I would recommend repeating his labs in the morning and we could follow this patient either inpatient or outpatient but would not recommend surgery at this time based on his resolution of his symptoms.  ____________________________ Jerrol Banana Burt Knack, MD rec:cms D: 03/04/2012 21:35:48 ET T: 03/05/2012 09:01:03 ET JOB#: [id number]  cc: Jerrol Banana. Burt Knack, MD, <Dictator> Florene Glen MD ELECTRONICALLY SIGNED 03/12/2012 9:30

## 2015-01-14 NOTE — Consult Note (Signed)
Chief Complaint:   Subjective/Chief Complaint doing well, some lower abd discomfort, no epigastric pain. tolerating clears.   VITAL SIGNS/ANCILLARY NOTES: **Vital Signs.:   18-Apr-13 14:41   Temperature Temperature (F) 97.8   Celsius 36.5   Temperature Source oral   Pulse Pulse 95   Respirations Respirations 20   Systolic BP Systolic BP 161   Diastolic BP (mmHg) Diastolic BP (mmHg) 84   Mean BP 108   Pulse Ox % Pulse Ox % 92   Pulse Ox Activity Level  At rest   Oxygen Delivery 2L   Brief Assessment:   Cardiac Regular    Respiratory clear BS    Gastrointestinal details normal Soft  Nondistended  No masses palpable  positive bowel sounds, mild infra umbilical discomfort to palpation   Routine Chem:  15-Apr-13 19:48    Lipase > 3000  16-Apr-13 04:16    Lipase > 3000  17-Apr-13 05:09    Lipase 801  18-Apr-13 05:16    Lipase 101  Thyroid:  18-Apr-13 05:16    Thyroid Stimulating Hormone 0.681   Assessment/Plan:  Assessment/Plan:   Assessment 1) pancreatitis, ude though likely biliary. resolved, tolerating clears.  diet advancement noted.    Plan 1) no new GI recs, anticipate d/c if tolerating diet advancement. will follow at a distance.   Electronic Signatures: Loistine Simas (MD)  (Signed 18-Apr-13 18:30)  Authored: Chief Complaint, VITAL SIGNS/ANCILLARY NOTES, Brief Assessment, Lab Results, Assessment/Plan   Last Updated: 18-Apr-13 18:30 by Loistine Simas (MD)

## 2015-01-14 NOTE — H&P (Signed)
PATIENT NAME:  Alan Ortega, Alan Ortega MR#:  [id number] DATE OF BIRTH:  April 13, 1934  DATE OF ADMISSION:  03/04/2012  PRIMARY CARE PHYSICIAN: Dr. Jacqualine Code REFERRING PHYSICIAN: Dr. Renard Hamper   CHIEF COMPLAINT: Abdominal pain.  HISTORY OF PRESENT ILLNESS: 81 year old male with a history of hypertension, coronary artery disease, chronic obstructive pulmonary disease, dyslipidemia who presents to the ER with a chief complaint of epigastric abdominal pain associated with some nausea starting at around 9:30 p.m. last evening. The patient denied any hematemesis or melena. He was recently admitted in April with an episode of pancreatitis and had an extensive evaluation done; namely an M.R.C.P. that did not show any acute pathology. At that time he was not found to have gallstones. The patient also has chronic right-sided hydronephrosis and a staghorn calculus on the right side which remained stable on his repeat CAT scan today. His lipase was greater than 3000 in the ED. His pain was somewhat improved with Zofran and morphine in the ER.   PAST MEDICAL HISTORY: 1. Dyslipidemia. 2. Hypertension. 3. Prostate cancer.  4. Coronary artery disease. 5. Chronic obstructive pulmonary disease. 6. Depression, anxiety.   PAST SURGICAL HISTORY:  1. Prostatectomy.  2. Coronary artery bypass graft.   CURRENT HOME MEDICATIONS:  1. Simvastatin 40 mg p.o. daily.  2. Diltiazem 60 mg p.o. daily.  3. Symbicort 160/4.5, 2 puffs b.i.d.  4. Lexapro 10 mg p.o. daily.   FAMILY HISTORY: No family history of colon cancer, colonic polyps.   SOCIAL HISTORY: The patient lives at home by himself. Wife died early this year. Remote tobacco use. No alcohol use. His daughter lives in [place].   REVIEW OF SYSTEMS: CONSTITUTIONAL: Fever, fatigue, weight loss or weight gain. HEENT: No blurry vision, double vision, inflammation, glaucoma, cataracts. No ear pain, tinnitus, hearing loss or allergies. RESPIRATORY: No cough. No wheezing. No  hemoptysis. No dyspnea. CARDIOVASCULAR: No chest pain, orthopnea, edema, arrhythmia, dyspnea on exertion, palpitations. GASTROINTESTINAL: Nausea, vomiting and epigastric pain. No hematemesis, melena. GENITOURINARY: No dysuria, hematuria, renal calculi. ENDOCRINE: No polyuria, nocturia, thyroid problems, increased sweating. INTEGUMENTARY: No acne. NEUROLOGICAL: No numbness, weakness, dysarthria, epilepsy, tremors or vertigo. PSYCHIATRIC: No anxiety, insomnia, bipolar disorder or depression. HEME: No anemia, easy bruising, bleeding, or swollen glands.   PHYSICAL EXAMINATION:  VITAL SIGNS: Pulse 87, blood pressure 148/76, respirations 18.   GENERAL: Currently appears to be alert, oriented, comfortable, in no acute cardiopulmonary distress.   HEENT: Head is atraumatic, normocephalic. Pupils equal and reactive to light. Sclerae anicteric. Oral mucosa is dry.   NECK: Supple. No thyromegaly. No JVD.   LUNGS: Mild wheezing bibasilar. No accessory muscle use. No rhonchi.   CARDIOVASCULAR: Regular rate and rhythm. No murmurs, rubs, or gallops.   ABDOMEN: Mild epigastric tenderness. No rebound. No guarding. Murphy's sign is negative. Good bowel sounds.   EXTREMITIES: No swelling, cyanosis, clubbing, or edema.   SKIN: Without any skin rashes.   NEUROLOGIC: Cranial nerves II through XII grossly intact. Deep tendon reflexes 2+ bilaterally. Babinski negative.   MUSCULOSKELETAL: Moving all four extremities spontaneously.   PSYCHIATRIC: Appropriate mood and affect. Intact judgment and insight.   LYMPHATIC: No axillary, inguinal, cervical lymphadenopathy.   LABORATORY, DIAGNOSTIC AND RADIOLOGICAL DATA: Urinalysis shows trace amount of leukocyte esterase and 15 WBCs per high-power field. Troponin negative, magnesium 1.9, lipase greater than 3000, WBC 17.9, hemoglobin 17.5, hematocrit 32.7, platelet count 175, glucose 131, BUN 19, creatinine 1.11, sodium 145, potassium 3.7, chloride 108, bicarbonate 24,  total bilirubin 0.6, alkaline phosphatase 79, ALT 44,  AST 98.   Recent lipid panel in April showed a triglyceride level of 107 therefore this is not being repeated.   ASSESSMENT AND PLAN:  1. Acute pancreatitis which is recurrent. The patient had a recent lipid profile therefore this is not being repeated. He does have gallstones on his CAT scan. His gallbladder wall appears to be thickened because of recurrent pancreatitis possibly related to gallstones. A surgical consultation will be obtained in the morning. We will treat him with the usual aggressive IV hydration and pain control with morphine and control of nausea with Zofran. GI consultation was obtained during his last admission therefore this is not being requested again. Please review the M.R.C.P. results from his last admission.  2. History of coronary artery disease. Currently chest pain free and therefore hold aspirin for now. 3. Hypertension, stable. 4. Dyslipidemia. The patient will resume statin when improved from his pancreatitis. 5. Leukocytosis, questionable right lower lobe infiltrate and therefore the patient received Levaquin in the ED which is being continued. A repeat chest x-ray may be repeated in the morning. Lovenox for deep vein thrombosis prophylaxis.  6. Chronic right-sided staghorn calculus, which remains unchanged with trace WBC in the urine therefore Levaquin is being continued.  7. Chronic obstructive pulmonary disease. The patient is without exacerbation and will continue his Symbicort during this admission.  8. Depression with worsening anxiety over the course of the last several months because of his wife's death. The patient received spiritual care during his last admission which may be reconsidered.  9. Disposition. He is a FULL CODE.   time spent 60 mins ____________________________ Reyne Dumas, MD na:cms D: 03/04/2012 03:43:10 ET T: 03/04/2012 06:13:57 ET JOB#: [id number]  cc: Reyne Dumas, MD,  <Dictator> Milinda Pointer. Jacqualine Code, MD Reyne Dumas MD ELECTRONICALLY SIGNED 03/05/2012 0:59

## 2015-01-14 NOTE — Consult Note (Signed)
Brief Consult Note: Diagnosis: pancreatitis.   Patient was seen by consultant.   Consult note dictated.   Comments: Appreciate consult for 81 y/o caucasian man with history of prostate cancer/prostatectomy, COPD, CAD, CABG, borderline DM, HL, HTN, depression, and anxiety for evaluation of pancreatitis. In chart review, patient was seen last month in GI clinic for abdominal pain and nausea that started after his wife died in 11-07-22: was started on Dexilant 60mg  po daily. States today it has been very effective until yesterday. States that he had a sudden onset of epigastric pain, nausea, and vomiting. Attributes this to some poundcake he ate earlier in the day. States it felt like something was hung down near his stomach. He drank some water but did it did not help. Denies trouble swallowing poundcake. Had several episodes of yellow emesis. No fevers, constituional symptoms, or other GI related complaints. Nausea and vomiting better today. Denies diarrhea, bloody/black tarry stools, bowel habit changes, constipation, problems swallowing, loss of weight. States he is hungry at present. Colonscopy 2004 was incomplete to the proximal transverse colon and appeared normal.  Triglycerides checked last month were low 100s; No history of sulfa drugs, azothiprine, etoh, gallstones, prior pancreatitis. Last A1C was 6.2.   Impression and plan: Pancreatitis of uncertain origin. WIll assess IgG4 and ANA. Recommend continuing IV PPI therapy, fluid replacement, and pain/nausea control. CT results pending. In talking with patient, he stated that he did not want endoscopic procedures. Did discuss patient with Dr Gustavo Lah.  Electronic Signatures: Stephens November H (NP)  (Signed 16-Apr-13 14:50)  Authored: Brief Consult Note   Last Updated: 16-Apr-13 14:50 by Theodore Demark (NP)

## 2015-01-21 NOTE — Discharge Summary (Signed)
PATIENT NAME:  Alan Ortega, POOLE MR#:  [id number] DATE OF BIRTH:  [birth year]  PRIMARY CARE PHYSICIAN: Milinda Pointer. Jacqualine Code, MD   DISCHARGE DIAGNOSES:  1. Acute on chronic respiratory failure with hypoxia.  2. Chronic obstructive pulmonary disease exacerbation.  3. Right lower lobe neumonia.  4. Elevated troponin due to demand ischemia. 5. Hypertension.  6. Hyperlipidemia.  7. Coronary artery disease.   CONDITION: Stable.   CODE STATUS: Full code.   HOME MEDICATIONS: Please refer to the medication reconciliation list.   DISCHARGE INSTRUCTIONS: The patient needs home oxygen 2 to 3 liters by nasal cannula.   DIET: Low-sodium diet.   ACTIVITY: As tolerated.   FOLLOWUP CARE: Follow with PCP within 1 to 2 weeks.   REASON FOR ADMISSION: Shortness of breath, cough with sputum 1 week.   HOSPITAL COURSE: The patient is a 63 Caucasian male with a history of chronic obstructive pulmonary disease and chronic respiratory failure on home oxygen come to the ED due to shortness of breath, cough with sputum for 1 week. For detailed history and physical examination, please refer to the admission note dictated by me. On admission date, the patient's chest x-ray showed right lower lobe pneumonia ABG showed pH 7.39 pCO2 40, pO2 104, WBC 11.4, hemoglobin 16.9. The patient was admitted for right lobe or lower lobe pneumonia with acute on chronic respiratory failure and chronic obstructive pulmonary disease exacerbation. After admission, the patient has been treated with Zithromax and Rocephin with nebulizer treatment and IV steroids. In addition,  Spiriva was added. The patient's shortness of breath, cough and sputum have much improved, but he still needs oxygen 2 to 3 liters. The patient's hypertension is controlled with Imdur and lisinopril. The patient has no complaints today.  His vital signs are stable. His lung sounds are clear. He is clinically stable and will be discharged to home today. I discussed the  patient's discharge plan with the patient and the patient's family member. Discussed with the nurse, case manager.   TIME SPENT: About 36 minutes.  ____________________________ Demetrios Loll, MD qc:ap D: 11/24/2014 15:37:00 ET T: 11/24/2014 23:14:52 ET JOB#: [id number]  cc: Demetrios Loll, MD, <Dictator> Demetrios Loll MD ELECTRONICALLY SIGNED 11/25/2014 14:15

## 2015-01-21 NOTE — H&P (Signed)
PATIENT NAME:  Alan Ortega, Alan Ortega MR#:  [id number] DATE OF BIRTH:  10-30-1933  DATE OF ADMISSION:  11/20/2014  PRIMARY CARE PHYSICIAN:  Dr. Debroah Baller, MD    REFERRING PHYSICIAN:  Dr. Jimmye Norman.     CHIEF COMPLAINT: Shortness of breath, cough with sputum for 1 week.   HISTORY OF PRESENT ILLNESS: A 81 year old Caucasian male with a history of COPD and chronic respiratory failure on home oxygen presented to the ED with the above chief complaint. The patient is alert, awake, oriented, in no acute distress. The patient said he has chronic shortness of breath on home oxygen 2.5 liters by nasal cannula at home. The patient has worsening shortness of breath for the past 1 week. In addition, the patient has a cough with greenish sputum and wheezing for 1 week.   The patient denies any fever or chills, no headache or dizziness; denies any nausea, vomiting, diarrhea. No chest pain, palpitation; no orthopnea or nocturnal dyspnea.   The patient's chest x-ray in the ED showed right side pneumonia, was treated with Zithromax and Rocephin.   PAST MEDICAL HISTORY: COPD, chronic respiratory failure on home oxygen 2.5 liters by nasal cannula; hypertension, dyslipidemia, prostate cancer, CAD, depression, anxiety and abdominal aneurysm; pancreatitis due to gallbladder stone.   PAST SURGICAL HISTORY:  Prostatectomy, CABG.   SOCIAL HISTORY: No smoking or drinking or illicit drugs but patient is a remote smoker.   FAMILY HISTORY: Noncontributory.   ALLERGIES: None.   HOME MEDICATIONS: Zocor 40 mg p.o. at bedtime, lisinopril 10 mg p.o. daily, Imdur 30 mg p.o. daily; aspirin 81 mg p.o. daily, albuterol 3 mL inhaled every 6 hours p.r.n.   REVIEW OF SYSTEMS:  CONSTITUTIONAL: The patient denies any fever or chills. No headache or dizziness. No weakness.  EYES: No double or blurry vision.  EARS AND NOSE AND THROAT: No postnasal drip, slurred speech or dysphagia.  CARDIOVASCULAR: No chest pain, palpitation,  orthopnea, or nocturnal dyspnea. No leg edema.  PULMONARY: Positive for cough, sputum, shortness of breath and wheezing; no hemoptysis.  GASTROINTESTINAL: No abdominal pain, nausea, vomiting, diarrhea. No melena or bloody stool.  GENITOURINARY: No dysuria, hematuria, or incontinence.  SKIN: No rash or jaundice.  NEUROLOGIC: No syncope, loss of consciousness, or seizure.  ENDOCRINE: No polyuria, polydipsia, heat or cold intolerance.  HEMATOLOGY: No easy bleeding or bleeding.   VITAL SIGNS: Temperature 99.3, blood pressure 110/75, pulse 122, oxygen saturation 95% on oxygen.   PHYSICAL EXAMINATION:  GENERAL: The patient is alert, awake, oriented, in acute distress.  HEENT: Pupils round and equal and reactive to light and accommodation, moist oral mucosa, clear oropharynx.  NECK: Supple. No JVD or carotid bruit. No lymphadenopathy. No thyromegaly.  CARDIOVASCULAR: S1 and S2, regular rate, tachycardia, no murmurs or gallops.  PULMONARY: Bilateral air entry, bilateral wheezing and crackles, no use of accessory muscle to breathe.  ABDOMEN: Soft. No distention or tenderness. No organomegaly. Bowel sounds present.  EXTREMITIES: No edema, clubbing or cyanosis. No calf tenderness. Bilateral pedal pulses present.  SKIN: No rash or jaundice.  NEUROLOGIC: A and O x 3, no focal deficit, power affect 5/5, sensory intact.   DIAGNOSTIC DATA: Chest x-ray showed right lower lobe pneumonia. ABG showed pH of 7.39, pCO2 of 40, pO2 of 104, BNP 2041, CK 505, WBC 11.4, hemoglobin 16.9, platelets 152,000, glucose 169, BUN 18, creatinine 1.19 electrolytes were normal, troponin 0.07.   IMPRESSIONS: 1.  Acute on chronic respiratory failure.  2.  Right lower lobe pneumonia.  3.  Elevated troponin, possibly due to demand ischemia.  4.  Hypertension.  5.  Coronary artery disease.  6.  Hyperlipidemia,  7.  Chronic obstructive pulmonary disease exacerbation.   PLAN OF TREATMENT: 1.  The patient will be admitted to  medical floor.  2.  The patient was treated with a Zithromax and Rocephin and we will continue with antibiotics.  3.  Give Xopenex round the clock.  4.  Start IV Solu-Medrol.  5.  In addition follow up CBC, blood culture.  6.  For CAD, continue patient's home medications including aspirin, lisinopril, Imdur and Zocor.   Discussed the patient's condition and plan of treatment with the patient and the patient the family member. The patient wants full code.   TIME SPENT: Of 62 minutes.    ____________________________ Demetrios Loll, MD qc:nt D: 11/20/2014 22:18:54 ET T: 11/20/2014 22:43:20 ET JOB#: [id number]  cc: Demetrios Loll, MD, <Dictator> Demetrios Loll MD ELECTRONICALLY SIGNED 11/22/2014 17:16

## 2015-04-25 ENCOUNTER — Other Ambulatory Visit: Payer: Self-pay | Admitting: Internal Medicine

## 2015-04-25 DIAGNOSIS — I714 Abdominal aortic aneurysm, without rupture, unspecified: Secondary | ICD-10-CM

## 2015-05-01 ENCOUNTER — Ambulatory Visit: Admission: RE | Admit: 2015-05-01 | Payer: Commercial Managed Care - HMO | Source: Ambulatory Visit

## 2015-05-09 ENCOUNTER — Ambulatory Visit
Admission: RE | Admit: 2015-05-09 | Discharge: 2015-05-09 | Disposition: A | Discharge status: Home / Self Care | Payer: Commercial Managed Care - HMO | Source: Ambulatory Visit | Attending: Internal Medicine | Admitting: Internal Medicine

## 2015-05-09 DIAGNOSIS — N138 Other obstructive and reflux uropathy: Secondary | ICD-10-CM | POA: Diagnosis not present

## 2015-05-09 DIAGNOSIS — N200 Calculus of kidney: Secondary | ICD-10-CM | POA: Diagnosis not present

## 2015-05-09 DIAGNOSIS — I728 Aneurysm of other specified arteries: Secondary | ICD-10-CM | POA: Insufficient documentation

## 2015-05-09 DIAGNOSIS — I714 Abdominal aortic aneurysm, without rupture, unspecified: Secondary | ICD-10-CM

## 2015-05-09 HISTORY — DX: Malignant (primary) neoplasm, unspecified: C80.1

## 2015-05-09 HISTORY — DX: Essential (primary) hypertension: I10

## 2015-05-09 MED ORDER — IOHEXOL 350 MG/ML SOLN
100.0000 mL | Freq: Once | INTRAVENOUS | Status: AC | PRN
  Administered 2015-05-09: 100 mL via INTRAVENOUS

## 2018-04-28 ENCOUNTER — Emergency Department
Admission: EM | Admit: 2018-04-28 | Discharge: 2018-04-28 | Disposition: A | Discharge status: Home / Self Care | Payer: Medicare HMO | Attending: Emergency Medicine | Admitting: Emergency Medicine

## 2018-04-28 ENCOUNTER — Other Ambulatory Visit: Payer: Self-pay

## 2018-04-28 ENCOUNTER — Emergency Department: Payer: Medicare HMO

## 2018-04-28 DIAGNOSIS — Y999 Unspecified external cause status: Secondary | ICD-10-CM | POA: Diagnosis not present

## 2018-04-28 DIAGNOSIS — Y92015 Private garage of single-family (private) house as the place of occurrence of the external cause: Secondary | ICD-10-CM | POA: Insufficient documentation

## 2018-04-28 DIAGNOSIS — Y9389 Activity, other specified: Secondary | ICD-10-CM | POA: Diagnosis not present

## 2018-04-28 DIAGNOSIS — W01198A Fall on same level from slipping, tripping and stumbling with subsequent striking against other object, initial encounter: Secondary | ICD-10-CM | POA: Diagnosis not present

## 2018-04-28 DIAGNOSIS — S61217A Laceration without foreign body of left little finger without damage to nail, initial encounter: Secondary | ICD-10-CM | POA: Insufficient documentation

## 2018-04-28 DIAGNOSIS — S61211A Laceration without foreign body of left index finger without damage to nail, initial encounter: Secondary | ICD-10-CM | POA: Insufficient documentation

## 2018-04-28 DIAGNOSIS — S0083XA Contusion of other part of head, initial encounter: Secondary | ICD-10-CM | POA: Diagnosis not present

## 2018-04-28 DIAGNOSIS — W19XXXA Unspecified fall, initial encounter: Secondary | ICD-10-CM

## 2018-04-28 DIAGNOSIS — R55 Syncope and collapse: Secondary | ICD-10-CM | POA: Diagnosis present

## 2018-04-28 DIAGNOSIS — I10 Essential (primary) hypertension: Secondary | ICD-10-CM | POA: Diagnosis not present

## 2018-04-28 LAB — COMPREHENSIVE METABOLIC PANEL
ALK PHOS: 53 U/L (ref 38–126)
ALT: 10 U/L (ref 0–44)
AST: 23 U/L (ref 15–41)
Albumin: 3.5 g/dL (ref 3.5–5.0)
Anion gap: 9 (ref 5–15)
BILIRUBIN TOTAL: 1.4 mg/dL — AB (ref 0.3–1.2)
BUN: 21 mg/dL (ref 8–23)
CALCIUM: 8.5 mg/dL — AB (ref 8.9–10.3)
CO2: 24 mmol/L (ref 22–32)
CREATININE: 1.19 mg/dL (ref 0.61–1.24)
Chloride: 104 mmol/L (ref 98–111)
GFR, EST NON AFRICAN AMERICAN: 55 mL/min — AB (ref >60)
Glucose, Bld: 94 mg/dL (ref 70–99)
Potassium: 3.7 mmol/L (ref 3.5–5.1)
Sodium: 137 mmol/L (ref 135–145)
TOTAL PROTEIN: 7.1 g/dL (ref 6.5–8.1)

## 2018-04-28 LAB — CBC
HEMATOCRIT: 45.3 % (ref 40.0–52.0)
HEMOGLOBIN: 15.4 g/dL (ref 13.0–18.0)
MCH: 32.9 pg (ref 26.0–34.0)
MCHC: 34 g/dL (ref 32.0–36.0)
MCV: 96.6 fL (ref 80.0–100.0)
Platelets: 217 10*3/uL (ref 150–440)
RBC: 4.69 MIL/uL (ref 4.40–5.90)
RDW: 13.1 % (ref 11.5–14.5)
WBC: 8.1 10*3/uL (ref 3.8–10.6)

## 2018-04-28 LAB — TROPONIN I

## 2018-04-28 LAB — CK: CK TOTAL: 232 U/L (ref 49–397)

## 2018-04-28 MED ORDER — SODIUM CHLORIDE 0.9 % IV BOLUS
1000.0000 mL | Freq: Once | INTRAVENOUS | Status: AC
  Administered 2018-04-28: 1000 mL via INTRAVENOUS

## 2018-04-28 NOTE — ED Provider Notes (Signed)
[facility] Emergency Department Provider Note  Time seen: 4:03 PM  I have reviewed the triage vital signs and the nursing notes.   HISTORY  Chief Complaint Fall and Loss of Consciousness    HPI Alan Ortega is a 84 y.o. male with a past medical history of hypertension, presents to the emergency department after a fall.  According to the patient he went out into his garage around 7 or 8 this morning and had a fall.  Friends did not get to him until proximately 3 PM.  Patient was down for a prolonged time.  States he could not get back up after the fall.  Denies any pain at this time.  Does have a hematoma to the back of the head and several skin tears to his left hand.  Patient uses a walker, states he was walking backwards and the walker slipped out in front of him causing him to fall backwards and hit his head.  Denies LOC.  Negative review of systems.   Past Medical History:  Diagnosis Date  . Cancer [facility])    Prostate  . Hypertension     There are no active problems to display for this patient.   History reviewed. No pertinent surgical history.  Prior to Admission medications   Not on File    No Known Allergies  No family history on file.  Social History Social History   Tobacco Use  . Smoking status: Never Smoker  . Smokeless tobacco: Never Used  Substance Use Topics  . Alcohol use: Never    Frequency: Never  . Drug use: Never    Review of Systems Constitutional: Negative for LOC Eyes: Negative for visual complaints Cardiovascular: Negative for chest pain. Respiratory: Negative for shortness of breath. Gastrointestinal: Negative for abdominal pain, vomiting and diarrhea.  States he feels hungry. Genitourinary: Negative for urinary compaints Musculoskeletal: Skin tears to left hand Skin: Skin tears as described above Neurological: Negative for headache.  Positive hematoma to back of head All other ROS  negative  ____________________________________________   PHYSICAL EXAM:  VITAL SIGNS: ED Triage Vitals  Enc Vitals Group     BP 04/28/18 1543 133/83     Pulse Rate 04/28/18 1543 89     Resp 04/28/18 1543 17     Temp 04/28/18 1543 98.4 F (36.9 C)     Temp Source 04/28/18 1543 Oral     SpO2 04/28/18 1538 97 %     Weight 04/28/18 1539 170 lb (77.1 kg)     Height 04/28/18 1539 5\' 6"  (1.676 m)     Head Circumference --      Peak Flow --      Pain Score 04/28/18 1539 0     Pain Loc --      Pain Edu? --      Excl. in [place]? --     Constitutional: Alert. Well appearing and in no distress. Eyes: Normal exam ENT   Head: Moderate sized hematoma to occipital scalp, small abrasion overlying hematoma, no laceration, hemostatic   Nose: No congestion/rhinnorhea.   Mouth/Throat: Mucous membranes are moist. Cardiovascular: Regular rhythm rate around 100 bpm. Respiratory: Normal respiratory effort without tachypnea nor retractions. Breath sounds are clear  Gastrointestinal: Soft and nontender. No distention. Musculoskeletal: Nontender with normal range of motion in all extremities. No lower extremity tenderness or edema. Neurologic:  Normal speech and language. No gross focal neurologic deficits  Skin: Skin is warm.  Patient has a skin tear  over his fifth finger on his left hand as well as his index finger. Psychiatric: Mood and affect are normal. Speech and behavior are normal.   ____________________________________________    EKG  EKG reviewed and interpreted by myself shows sinus tachycardia 100 bpm with a narrow QRS, normal axis, largely normal intervals with nonspecific ST changes.  No obvious ST elevation.  ____________________________________________    RADIOLOGY  CT had negative for acute abnormality.  ____________________________________________   INITIAL IMPRESSION / ASSESSMENT AND PLAN / ED COURSE  Pertinent labs & imaging results that were available during  my care of the patient were reviewed by me and considered in my medical decision making (see chart for details).  Patient presents to the emergency department after a fall this morning but was down for a prolonged amount of time before being found by friends/family.  Currently the patient appears well with no complaints.  Does have 2 skin tears to his left hand, has a hematoma to the back of the head.  Good range of motion in all extremities without pain elicited, no spinal tenderness.  Patient is able to sit up in bed.  We will obtain CT imaging of the head, labs including a total CK.  We will IV hydrate while awaiting results.  Skin tears will be repaired with Steri-Strips.  Differential would include rhabdomyolysis, mechanical fall, dehydration, ICH, hematoma.  Labs are largely within normal limits.  CT negative.  Patient is requesting to be discharged.  He appears well.  Refuses sutures, repaired the skin tears with Steri-Strips.  He will follow-up with his doctor. ____________________________________________   FINAL CLINICAL IMPRESSION(S) / ED DIAGNOSES  Cheral Marker, MD 04/28/18 [phone]

## 2018-04-28 NOTE — ED Notes (Signed)
Patient transported to CT 

## 2018-04-28 NOTE — ED Triage Notes (Signed)
Pt arrived via ems for report of fall in garage - pt was down in garage for at least 45 minutes when he was found - pt remembers starting to fall and then waking up on the floor - he states that he hit his head on the concrete floor - pt denies N/V, denies change in vision, denies headache - family reports that pt has been confused over the past week - pt lives alone at home and was convinced to come to the ED for eval after fall

## 2018-09-02 ENCOUNTER — Other Ambulatory Visit: Payer: Self-pay | Admitting: Internal Medicine

## 2018-09-02 DIAGNOSIS — R2689 Other abnormalities of gait and mobility: Secondary | ICD-10-CM

## 2018-09-02 DIAGNOSIS — R29898 Other symptoms and signs involving the musculoskeletal system: Secondary | ICD-10-CM

## 2018-09-02 DIAGNOSIS — R262 Difficulty in walking, not elsewhere classified: Secondary | ICD-10-CM

## 2018-09-08 ENCOUNTER — Ambulatory Visit: Payer: Self-pay | Admitting: Surgery

## 2018-09-08 MED ORDER — CEFAZOLIN SODIUM-DEXTROSE 2-4 GM/100ML-% IV SOLN
2.0000 g | INTRAVENOUS | Status: AC
  Administered 2018-09-09: 2 g via INTRAVENOUS

## 2018-09-08 NOTE — H&P (Signed)
Subjective:   CC: Mass of leg, right [R22.41]  HPI:  Alan Ortega is a 84 y.o. male who was referred by Lisa Roca* for evaluation of above. First noted a few week ago.  Symptoms include: Asymptomatic, but patient and daughter states there has been some discharge from this mass.  Not sure how long it has been there.   Past Medical History:  has a past medical history of AAA (abdominal aortic aneurysm) (CMS-HCC), COPD (chronic obstructive pulmonary disease) (CMS-HCC), Coronary artery disease, Dementia (CMS-HCC), History of nephrolithiasis, Hypercholesterolemia, Hypertension, Prostate cancer (CMS-HCC), Situational anxiety, and Situational depression.  Past Surgical History:  has a past surgical history that includes Prostatectomy and Coronary artery bypass graft (10/07/2000).  Family History: family history includes Cancer in his brother; Heart disease in his brother; High blood pressure (Hypertension) in an other family member.  Social History:  reports that he quit smoking about 22 years ago. He has never used smokeless tobacco. He reports that he does not drink alcohol or use drugs.  Current Medications: has a current medication list which includes the following prescription(s): aspirin, cefuroxime, doxycycline, escitalopram oxalate, oxygen-air delivery systems, and simvastatin.  Allergies:  No Known Allergies  ROS:  A 15 point review of systems was performed and pertinent positives and negatives noted in HPI   Objective:   BP 132/80   Pulse 80   Temp 36.5 C (97.7 F) (Oral)   Ht 167.6 cm (5\' 6" )   Wt 66.7 kg (147 lb 0.8 oz)   BMI 23.73 kg/m   Constitutional :  alert, appears stated age, cooperative and no distress  Lymphatics/Throat:  no asymmetry, masses, or scars  Respiratory:  clear to auscultation bilaterally  Cardiovascular:  regular rate and rhythm  Gastrointestinal: soft, non-tender; bowel sounds normal; no masses,  no organomegaly.     Musculoskeletal: Steady gait and movement  Skin: Cool and moist.  A pedunculated egg-shaped fungating mass noted on right medial thigh aspect, proximal third of thigh, measuring approx 7cm x 6cm x 6cm.  The pedicle is roughly 1cm wide in diameter with normal looking skin.  Non-tender, no obvious discharge from the mass on exam today.  Incidental note of large hydrocele in right scrotum, left reducible inguinal hernia extending into left scrotum  Psychiatric: Normal affect, non-agitated, not confused       LABS:  n/a   RADS: n/a  Assessment:      Mass of leg, right [R22.41]  Plan:   1. Mass of leg, right [R22.41] Discussed surgical excision.  Alternatives include continued observation.  Benefits include pathologic evaluation, improved cosmesis. Discussed the risk of surgery including recurrence, chronic pain, post-op infxn, poor cosmesis, poor/delayed wound healing, and possible re-operation to address said risks. The risks of general anesthetic, if used, includes MI, CVA, sudden death or even reaction to anesthetic medications also discussed.  Typical post-op recovery time of 3-5 days with possible activity restrictions were also discussed.  The patient and daughter verbalized understanding and all questions were answered to the patient's satisfaction.  2. Will proceed with excisional biopsy.     Electronically signed by Benjamine Sprague, DO on 09/08/2018 5:02 PM

## 2018-09-08 NOTE — H&P (View-Only) (Signed)
Subjective:   CC: Mass of leg, right [R22.41]  HPI:  Alan Ortega is a 84 y.o. male who was referred by Lisa Roca* for evaluation of above. First noted a few week ago.  Symptoms include: Asymptomatic, but patient and daughter states there has been some discharge from this mass.  Not sure how long it has been there.   Past Medical History:  has a past medical history of AAA (abdominal aortic aneurysm) (CMS-HCC), COPD (chronic obstructive pulmonary disease) (CMS-HCC), Coronary artery disease, Dementia (CMS-HCC), History of nephrolithiasis, Hypercholesterolemia, Hypertension, Prostate cancer (CMS-HCC), Situational anxiety, and Situational depression.  Past Surgical History:  has a past surgical history that includes Prostatectomy and Coronary artery bypass graft (10/07/2000).  Family History: family history includes Cancer in his brother; Heart disease in his brother; High blood pressure (Hypertension) in an other family member.  Social History:  reports that he quit smoking about 22 years ago. He has never used smokeless tobacco. He reports that he does not drink alcohol or use drugs.  Current Medications: has a current medication list which includes the following prescription(s): aspirin, cefuroxime, doxycycline, escitalopram oxalate, oxygen-air delivery systems, and simvastatin.  Allergies:  No Known Allergies  ROS:  A 15 point review of systems was performed and pertinent positives and negatives noted in HPI   Objective:   BP 132/80   Pulse 80   Temp 36.5 C (97.7 F) (Oral)   Ht 167.6 cm (5\' 6" )   Wt 66.7 kg (147 lb 0.8 oz)   BMI 23.73 kg/m   Constitutional :  alert, appears stated age, cooperative and no distress  Lymphatics/Throat:  no asymmetry, masses, or scars  Respiratory:  clear to auscultation bilaterally  Cardiovascular:  regular rate and rhythm  Gastrointestinal: soft, non-tender; bowel sounds normal; no masses,  no organomegaly.     Musculoskeletal: Steady gait and movement  Skin: Cool and moist.  A pedunculated egg-shaped fungating mass noted on right medial thigh aspect, proximal third of thigh, measuring approx 7cm x 6cm x 6cm.  The pedicle is roughly 1cm wide in diameter with normal looking skin.  Non-tender, no obvious discharge from the mass on exam today.  Incidental note of large hydrocele in right scrotum, left reducible inguinal hernia extending into left scrotum  Psychiatric: Normal affect, non-agitated, not confused       LABS:  n/a   RADS: n/a  Assessment:      Mass of leg, right [R22.41]  Plan:   1. Mass of leg, right [R22.41] Discussed surgical excision.  Alternatives include continued observation.  Benefits include pathologic evaluation, improved cosmesis. Discussed the risk of surgery including recurrence, chronic pain, post-op infxn, poor cosmesis, poor/delayed wound healing, and possible re-operation to address said risks. The risks of general anesthetic, if used, includes MI, CVA, sudden death or even reaction to anesthetic medications also discussed.  Typical post-op recovery time of 3-5 days with possible activity restrictions were also discussed.  The patient and daughter verbalized understanding and all questions were answered to the patient's satisfaction.  2. Will proceed with excisional biopsy.     Electronically signed by Benjamine Sprague, DO on 09/08/2018 5:02 PM

## 2018-09-09 ENCOUNTER — Other Ambulatory Visit: Payer: Self-pay

## 2018-09-09 ENCOUNTER — Ambulatory Visit: Payer: Medicare HMO | Admitting: Anesthesiology

## 2018-09-09 ENCOUNTER — Ambulatory Visit
Admission: RE | Admit: 2018-09-09 | Discharge: 2018-09-09 | Disposition: A | Discharge status: Home / Self Care | Payer: Medicare HMO | Attending: Surgery | Admitting: Surgery

## 2018-09-09 ENCOUNTER — Encounter: Payer: Self-pay | Admitting: Anesthesiology

## 2018-09-09 ENCOUNTER — Encounter
Admission: RE | Disposition: A | Discharge status: Home / Self Care | Payer: Self-pay | Source: Home / Self Care | Attending: Surgery

## 2018-09-09 DIAGNOSIS — D1723 Benign lipomatous neoplasm of skin and subcutaneous tissue of right leg: Secondary | ICD-10-CM | POA: Diagnosis present

## 2018-09-09 DIAGNOSIS — I10 Essential (primary) hypertension: Secondary | ICD-10-CM | POA: Insufficient documentation

## 2018-09-09 DIAGNOSIS — Z951 Presence of aortocoronary bypass graft: Secondary | ICD-10-CM | POA: Insufficient documentation

## 2018-09-09 DIAGNOSIS — Z8679 Personal history of other diseases of the circulatory system: Secondary | ICD-10-CM | POA: Diagnosis not present

## 2018-09-09 DIAGNOSIS — Z7982 Long term (current) use of aspirin: Secondary | ICD-10-CM | POA: Diagnosis not present

## 2018-09-09 DIAGNOSIS — I2510 Atherosclerotic heart disease of native coronary artery without angina pectoris: Secondary | ICD-10-CM | POA: Insufficient documentation

## 2018-09-09 DIAGNOSIS — Z87442 Personal history of urinary calculi: Secondary | ICD-10-CM | POA: Insufficient documentation

## 2018-09-09 DIAGNOSIS — R2241 Localized swelling, mass and lump, right lower limb: Secondary | ICD-10-CM

## 2018-09-09 DIAGNOSIS — Z87891 Personal history of nicotine dependence: Secondary | ICD-10-CM | POA: Insufficient documentation

## 2018-09-09 DIAGNOSIS — E7800 Pure hypercholesterolemia, unspecified: Secondary | ICD-10-CM | POA: Insufficient documentation

## 2018-09-09 DIAGNOSIS — Z79899 Other long term (current) drug therapy: Secondary | ICD-10-CM | POA: Diagnosis not present

## 2018-09-09 DIAGNOSIS — F0390 Unspecified dementia without behavioral disturbance: Secondary | ICD-10-CM | POA: Diagnosis not present

## 2018-09-09 DIAGNOSIS — Z8546 Personal history of malignant neoplasm of prostate: Secondary | ICD-10-CM | POA: Insufficient documentation

## 2018-09-09 DIAGNOSIS — J449 Chronic obstructive pulmonary disease, unspecified: Secondary | ICD-10-CM | POA: Diagnosis not present

## 2018-09-09 HISTORY — PX: EXCISION MASS LOWER EXTREMETIES: SHX6705

## 2018-09-09 SURGERY — EXCISION MASS LOWER EXTREMITIES
Anesthesia: General | Site: Thigh | Laterality: Right

## 2018-09-09 MED ORDER — PROPOFOL 500 MG/50ML IV EMUL
INTRAVENOUS | Status: DC | PRN
  Administered 2018-09-09: 100 ug/kg/min via INTRAVENOUS

## 2018-09-09 MED ORDER — PHENYLEPHRINE HCL 10 MG/ML IJ SOLN
INTRAMUSCULAR | Status: AC
  Filled 2018-09-09: qty 1

## 2018-09-09 MED ORDER — PROPOFOL 10 MG/ML IV BOLUS
INTRAVENOUS | Status: DC | PRN
  Administered 2018-09-09: 50 mg via INTRAVENOUS

## 2018-09-09 MED ORDER — CHLORHEXIDINE GLUCONATE CLOTH 2 % EX PADS: 6.0000 | MEDICATED_PAD | Freq: Once | CUTANEOUS | Status: DC

## 2018-09-09 MED ORDER — LIDOCAINE HCL (PF) 2 % IJ SOLN
INTRAMUSCULAR | Status: AC
  Filled 2018-09-09: qty 10

## 2018-09-09 MED ORDER — ACETAMINOPHEN 325 MG PO TABS: 650.0000 mg | ORAL_TABLET | Freq: Three times a day (TID) | ORAL | 0 refills | Status: DC | PRN

## 2018-09-09 MED ORDER — PHENYLEPHRINE HCL 10 MG/ML IJ SOLN
INTRAMUSCULAR | Status: DC | PRN
  Administered 2018-09-09 (×2): 100 ug via INTRAVENOUS

## 2018-09-09 MED ORDER — LIDOCAINE HCL (PF) 1 % IJ SOLN
INTRAMUSCULAR | Status: AC
  Filled 2018-09-09: qty 30

## 2018-09-09 MED ORDER — HYDROCODONE-ACETAMINOPHEN 5-325 MG PO TABS: 1.0000 | ORAL_TABLET | Freq: Four times a day (QID) | ORAL | 0 refills | Status: DC | PRN

## 2018-09-09 MED ORDER — LIDOCAINE 2% (20 MG/ML) 5 ML SYRINGE
INTRAMUSCULAR | Status: DC | PRN
  Administered 2018-09-09: 50 mg via INTRAVENOUS

## 2018-09-09 MED ORDER — DEXMEDETOMIDINE HCL 200 MCG/2ML IV SOLN
INTRAVENOUS | Status: DC | PRN
  Administered 2018-09-09: 12 ug via INTRAVENOUS

## 2018-09-09 MED ORDER — BUPIVACAINE HCL 0.5 % IJ SOLN
INTRAMUSCULAR | Status: DC | PRN
  Administered 2018-09-09: 3 mL

## 2018-09-09 MED ORDER — LACTATED RINGERS IV SOLN
INTRAVENOUS | Status: DC
  Administered 2018-09-09: 16:00:00 via INTRAVENOUS

## 2018-09-09 MED ORDER — CEFAZOLIN SODIUM-DEXTROSE 2-4 GM/100ML-% IV SOLN
INTRAVENOUS | Status: AC
  Filled 2018-09-09: qty 100

## 2018-09-09 MED ORDER — PROPOFOL 10 MG/ML IV BOLUS
INTRAVENOUS | Status: AC
  Filled 2018-09-09: qty 20

## 2018-09-09 MED ORDER — FAMOTIDINE 20 MG PO TABS
20.0000 mg | ORAL_TABLET | Freq: Once | ORAL | Status: AC
  Administered 2018-09-09: 20 mg via ORAL

## 2018-09-09 MED ORDER — LIDOCAINE HCL 1 % IJ SOLN
INTRAMUSCULAR | Status: DC | PRN
  Administered 2018-09-09: 3 mL

## 2018-09-09 MED ORDER — DOCUSATE SODIUM 100 MG PO CAPS: 100.0000 mg | ORAL_CAPSULE | Freq: Two times a day (BID) | ORAL | 0 refills | Status: AC | PRN

## 2018-09-09 MED ORDER — IBUPROFEN 800 MG PO TABS: 800.0000 mg | ORAL_TABLET | Freq: Three times a day (TID) | ORAL | 0 refills | Status: DC | PRN

## 2018-09-09 MED ORDER — FAMOTIDINE 20 MG PO TABS
ORAL_TABLET | ORAL | Status: AC
  Administered 2018-09-09: 20 mg via ORAL
  Filled 2018-09-09: qty 1

## 2018-09-09 MED ORDER — BUPIVACAINE HCL (PF) 0.5 % IJ SOLN
INTRAMUSCULAR | Status: AC
  Filled 2018-09-09: qty 30

## 2018-09-09 SURGICAL SUPPLY — 17 items
CHLORAPREP W/TINT 10.5 ML (MISCELLANEOUS) ×3 IMPLANT
COVER WAND RF STERILE (DRAPES) ×3 IMPLANT
DERMABOND ADVANCED (GAUZE/BANDAGES/DRESSINGS) ×2
DERMABOND ADVANCED .7 DNX12 (GAUZE/BANDAGES/DRESSINGS) ×1 IMPLANT
DRAPE LAPAROTOMY 100X77 ABD (DRAPES) ×3 IMPLANT
ELECT REM PT RETURN 9FT ADLT (ELECTROSURGICAL) ×3
ELECTRODE REM PT RTRN 9FT ADLT (ELECTROSURGICAL) ×1 IMPLANT
GLOVE BIO SURGEON STRL SZ7 (GLOVE) ×3 IMPLANT
GLOVE BIO SURGEON STRL SZ7.5 (GLOVE) ×3 IMPLANT
GOWN STRL REUS W/ TWL LRG LVL3 (GOWN DISPOSABLE) ×2 IMPLANT
GOWN STRL REUS W/TWL LRG LVL3 (GOWN DISPOSABLE) ×4
NEEDLE HYPO 25X1 1.5 SAFETY (NEEDLE) ×3 IMPLANT
PACK BASIN MINOR ARMC (MISCELLANEOUS) ×3 IMPLANT
SUT MNCRL AB 4-0 PS2 18 (SUTURE) ×6 IMPLANT
SUT VIC AB 3-0 SH 27 (SUTURE)
SUT VIC AB 3-0 SH 27X BRD (SUTURE) IMPLANT
SYR 10ML LL (SYRINGE) ×3 IMPLANT

## 2018-09-09 NOTE — Anesthesia Preprocedure Evaluation (Signed)
Anesthesia Evaluation  Patient identified by MRN, date of birth, ID band Patient awake    Reviewed: Allergy & Precautions, H&P , NPO status , Patient's Chart, lab work & pertinent test results, reviewed documented beta blocker date and time   History of Anesthesia Complications Negative for: history of anesthetic complications  Airway Mallampati: II  TM Distance: >3 FB Neck ROM: full  Mouth opening: Limited Mouth Opening  Dental  (+) Missing, Dental Advidsory Given, Poor Dentition, Caps   Pulmonary shortness of breath and with exertion, COPD, neg recent URI, former smoker,           Cardiovascular Exercise Tolerance: Good hypertension, (-) angina+ CAD and + CABG  (-) Past MI and (-) Cardiac Stents (-) dysrhythmias (-) Valvular Problems/Murmurs     Neuro/Psych negative neurological ROS  negative psych ROS   GI/Hepatic negative GI ROS, Neg liver ROS,   Endo/Other  negative endocrine ROS  Renal/GU CRFRenal disease  negative genitourinary   Musculoskeletal   Abdominal   Peds  Hematology negative hematology ROS (+)   Anesthesia Other Findings Past Medical History: No date: Cancer ([place])     Comment:  Prostate No date: Hypertension   Reproductive/Obstetrics negative OB ROS                             Anesthesia Physical Anesthesia Plan  ASA: III  Anesthesia Plan: General   Post-op Pain Management:    Induction: Intravenous  PONV Risk Score and Plan: 2 and Propofol infusion, TIVA and Ondansetron  Airway Management Planned: Simple Face Mask, Nasal Cannula and Natural Airway  Additional Equipment:   Intra-op Plan:   Post-operative Plan:   Informed Consent: I have reviewed the patients History and Physical, chart, labs and discussed the procedure including the risks, benefits and alternatives for the proposed anesthesia with the patient or authorized representative who has indicated  his/her understanding and acceptance.   Dental Advisory Given  Plan Discussed with: Anesthesiologist, CRNA and Surgeon  Anesthesia Plan Comments:         Anesthesia Quick Evaluation

## 2018-09-09 NOTE — Anesthesia Post-op Follow-up Note (Signed)
Anesthesia QCDR form completed.        

## 2018-09-09 NOTE — Interval H&P Note (Signed)
History and Physical Interval Note:  09/09/2018 4:59 PM  Alan Ortega  has presented today for surgery, with the diagnosis of Right leg mass  The various methods of treatment have been discussed with the patient and family. After consideration of risks, benefits and other options for treatment, the patient has consented to  Procedure(s): EXCISION MASS LOWER EXTREMETIES (Right) as a surgical intervention .  The patient's history has been reviewed, patient examined, no change in status, stable for surgery.  I have reviewed the patient's chart and labs.  Questions were answered to the patient's satisfaction.     Chinmayi Rumer Lysle Pearl

## 2018-09-09 NOTE — Discharge Instructions (Signed)

## 2018-09-09 NOTE — Transfer of Care (Signed)
Immediate Anesthesia Transfer of Care Note  Patient: Alan Ortega  Procedure(s) Performed: EXCISION MASS LOWER EXTREMETIES (Right Thigh)  Patient Location: PACU  Anesthesia Type:General  Level of Consciousness: awake  Airway & Oxygen Therapy: Patient Spontanous Breathing  Post-op Assessment: Report given to RN and Post -op Vital signs reviewed and stable  Post vital signs: Reviewed  Last Vitals:  Vitals Value Taken Time  BP 100/60 09/09/2018  6:06 PM  Temp 36.5 C 09/09/2018  6:06 PM  Pulse 77 09/09/2018  6:06 PM  Resp 16 09/09/2018  6:06 PM  SpO2 95 % 09/09/2018  6:06 PM  Vitals shown include unvalidated device data.  Last Pain:  Vitals:   09/09/18 1528  TempSrc: Temporal         Complications: No apparent anesthesia complications

## 2018-09-10 ENCOUNTER — Encounter: Payer: Self-pay | Admitting: Surgery

## 2018-09-10 NOTE — Anesthesia Postprocedure Evaluation (Signed)
Anesthesia Post Note  Patient: Alan Ortega  Procedure(s) Performed: EXCISION MASS LOWER EXTREMETIES (Right Thigh)  Patient location during evaluation: PACU Anesthesia Type: General Level of consciousness: awake and alert Pain management: pain level controlled Vital Signs Assessment: post-procedure vital signs reviewed and stable Respiratory status: spontaneous breathing, nonlabored ventilation, respiratory function stable and patient connected to nasal cannula oxygen Cardiovascular status: blood pressure returned to baseline and stable Postop Assessment: no apparent nausea or vomiting Anesthetic complications: no     Last Vitals:  Vitals:   09/09/18 1850 09/09/18 1924  BP: (!) 127/93 130/72  Pulse: 69 77  Resp: (!) 97 16  Temp: 36.6 C   SpO2: 97% 96%    Last Pain:  Vitals:   09/09/18 1924  TempSrc:   PainSc: 0-No pain                 Martha Clan

## 2018-09-10 NOTE — Op Note (Signed)
Pre-Op Dx: right leg mass Post-Op Dx: same Anesthesia: local EBL: minimal Complications:  none apparent Specimen: right leg mass Procedure: excisional biopsy of right leg mass Surgeon: Lysle Pearl   Description of Procedure:  Pt transferred to OR. Placed in supine frog leg position.  MAC anesthesia administered.  IV abx started.  Time out performed.   Area sterilized and draped in usual position.  Local infused to area previously marked.  7 cm elliptical incision made around the 2cm stalk arising from medial aspect of right thigh attached to the fungating mass.  Incision carried through dermis with 15blade and normal subcutaneous fat noted in subcutaneous layer deep to stalk.  The  5cm x 6cm x 7cm pedunculated, fungating mass along with skin stalk passed off field pending pathology.  Wound hemostasis noted, then closed in two layer fashion with 3-0 vicryl in interrupted fashion for deep dermal layer, then running 4-0 monocryl in subcuticular fashion for epidermal layer under minimal tension.  Wound then dressed with dermabond.  Pt tolerated procedure well, and transferred to PACU in stable condition. Sponge and instrument count correct at end of procedure.

## 2018-09-16 LAB — SURGICAL PATHOLOGY

## 2018-09-25 ENCOUNTER — Ambulatory Visit: Payer: Medicare HMO

## 2018-10-03 ENCOUNTER — Emergency Department: Payer: Medicare HMO

## 2018-10-03 ENCOUNTER — Inpatient Hospital Stay
Admission: EM | Admit: 2018-10-03 | Discharge: 2018-10-10 | DRG: 871 | Disposition: A | Discharge status: Home Health | Payer: Medicare HMO | Attending: Internal Medicine | Admitting: Internal Medicine

## 2018-10-03 DIAGNOSIS — I255 Ischemic cardiomyopathy: Secondary | ICD-10-CM | POA: Diagnosis present

## 2018-10-03 DIAGNOSIS — N320 Bladder-neck obstruction: Secondary | ICD-10-CM | POA: Diagnosis present

## 2018-10-03 DIAGNOSIS — I110 Hypertensive heart disease with heart failure: Secondary | ICD-10-CM | POA: Diagnosis present

## 2018-10-03 DIAGNOSIS — J9601 Acute respiratory failure with hypoxia: Secondary | ICD-10-CM | POA: Diagnosis not present

## 2018-10-03 DIAGNOSIS — I4510 Unspecified right bundle-branch block: Secondary | ICD-10-CM | POA: Diagnosis present

## 2018-10-03 DIAGNOSIS — J9621 Acute and chronic respiratory failure with hypoxia: Secondary | ICD-10-CM | POA: Diagnosis present

## 2018-10-03 DIAGNOSIS — N200 Calculus of kidney: Secondary | ICD-10-CM | POA: Diagnosis present

## 2018-10-03 DIAGNOSIS — R34 Anuria and oliguria: Secondary | ICD-10-CM | POA: Diagnosis not present

## 2018-10-03 DIAGNOSIS — J189 Pneumonia, unspecified organism: Secondary | ICD-10-CM | POA: Diagnosis present

## 2018-10-03 DIAGNOSIS — Z9981 Dependence on supplemental oxygen: Secondary | ICD-10-CM

## 2018-10-03 DIAGNOSIS — I2510 Atherosclerotic heart disease of native coronary artery without angina pectoris: Secondary | ICD-10-CM | POA: Diagnosis present

## 2018-10-03 DIAGNOSIS — Z7982 Long term (current) use of aspirin: Secondary | ICD-10-CM

## 2018-10-03 DIAGNOSIS — J9622 Acute and chronic respiratory failure with hypercapnia: Secondary | ICD-10-CM | POA: Diagnosis present

## 2018-10-03 DIAGNOSIS — A419 Sepsis, unspecified organism: Secondary | ICD-10-CM | POA: Diagnosis present

## 2018-10-03 DIAGNOSIS — I10 Essential (primary) hypertension: Secondary | ICD-10-CM | POA: Diagnosis present

## 2018-10-03 DIAGNOSIS — J449 Chronic obstructive pulmonary disease, unspecified: Secondary | ICD-10-CM | POA: Diagnosis present

## 2018-10-03 DIAGNOSIS — F0390 Unspecified dementia without behavioral disturbance: Secondary | ICD-10-CM | POA: Diagnosis present

## 2018-10-03 DIAGNOSIS — E785 Hyperlipidemia, unspecified: Secondary | ICD-10-CM | POA: Diagnosis present

## 2018-10-03 DIAGNOSIS — Z66 Do not resuscitate: Secondary | ICD-10-CM | POA: Diagnosis present

## 2018-10-03 DIAGNOSIS — J440 Chronic obstructive pulmonary disease with acute lower respiratory infection: Secondary | ICD-10-CM | POA: Diagnosis present

## 2018-10-03 DIAGNOSIS — Z8546 Personal history of malignant neoplasm of prostate: Secondary | ICD-10-CM

## 2018-10-03 DIAGNOSIS — J918 Pleural effusion in other conditions classified elsewhere: Secondary | ICD-10-CM | POA: Diagnosis present

## 2018-10-03 DIAGNOSIS — R310 Gross hematuria: Secondary | ICD-10-CM

## 2018-10-03 DIAGNOSIS — Z9861 Coronary angioplasty status: Secondary | ICD-10-CM

## 2018-10-03 DIAGNOSIS — Z8679 Personal history of other diseases of the circulatory system: Secondary | ICD-10-CM

## 2018-10-03 DIAGNOSIS — I509 Heart failure, unspecified: Secondary | ICD-10-CM

## 2018-10-03 DIAGNOSIS — I493 Ventricular premature depolarization: Secondary | ICD-10-CM | POA: Diagnosis present

## 2018-10-03 DIAGNOSIS — J441 Chronic obstructive pulmonary disease with (acute) exacerbation: Secondary | ICD-10-CM | POA: Diagnosis present

## 2018-10-03 DIAGNOSIS — R0602 Shortness of breath: Secondary | ICD-10-CM

## 2018-10-03 DIAGNOSIS — Z79899 Other long term (current) drug therapy: Secondary | ICD-10-CM

## 2018-10-03 DIAGNOSIS — I5021 Acute systolic (congestive) heart failure: Secondary | ICD-10-CM | POA: Diagnosis present

## 2018-10-03 DIAGNOSIS — J8410 Pulmonary fibrosis, unspecified: Secondary | ICD-10-CM | POA: Diagnosis present

## 2018-10-03 DIAGNOSIS — N393 Stress incontinence (female) (male): Secondary | ICD-10-CM | POA: Diagnosis present

## 2018-10-03 DIAGNOSIS — Z951 Presence of aortocoronary bypass graft: Secondary | ICD-10-CM

## 2018-10-03 HISTORY — DX: Atherosclerotic heart disease of native coronary artery without angina pectoris: I25.10

## 2018-10-03 HISTORY — DX: Chronic obstructive pulmonary disease, unspecified: J44.9

## 2018-10-03 HISTORY — DX: Hyperlipidemia, unspecified: E78.5

## 2018-10-03 LAB — CBC WITH DIFFERENTIAL/PLATELET
Abs Immature Granulocytes: 0.07 10*3/uL (ref 0.00–0.07)
Basophils Absolute: 0.1 10*3/uL (ref 0.0–0.1)
Basophils Relative: 0 %
Eosinophils Absolute: 0.1 10*3/uL (ref 0.0–0.5)
Eosinophils Relative: 1 %
HCT: 45.5 % (ref 39.0–52.0)
Hemoglobin: 14.5 g/dL (ref 13.0–17.0)
Immature Granulocytes: 0 %
Lymphocytes Relative: 22 %
Lymphs Abs: 3.6 10*3/uL (ref 0.7–4.0)
MCH: 31.6 pg (ref 26.0–34.0)
MCHC: 31.9 g/dL (ref 30.0–36.0)
MCV: 99.1 fL (ref 80.0–100.0)
Monocytes Absolute: 1.6 10*3/uL — ABNORMAL HIGH (ref 0.1–1.0)
Monocytes Relative: 10 %
Neutro Abs: 10.9 10*3/uL — ABNORMAL HIGH (ref 1.7–7.7)
Neutrophils Relative %: 67 %
PLATELETS: 209 10*3/uL (ref 150–400)
RBC: 4.59 MIL/uL (ref 4.22–5.81)
RDW: 13.2 % (ref 11.5–15.5)
WBC: 16.3 10*3/uL — ABNORMAL HIGH (ref 4.0–10.5)
nRBC: 0 % (ref 0.0–0.2)

## 2018-10-03 LAB — COMPREHENSIVE METABOLIC PANEL
ALT: 9 U/L (ref 0–44)
AST: 18 U/L (ref 15–41)
Albumin: 3.8 g/dL (ref 3.5–5.0)
Alkaline Phosphatase: 69 U/L (ref 38–126)
Anion gap: 12 (ref 5–15)
BUN: 21 mg/dL (ref 8–23)
CALCIUM: 8.4 mg/dL — AB (ref 8.9–10.3)
CO2: 23 mmol/L (ref 22–32)
Chloride: 107 mmol/L (ref 98–111)
Creatinine, Ser: 1.28 mg/dL — ABNORMAL HIGH (ref 0.61–1.24)
GFR calc Af Amer: >60 mL/min (ref >60)
GFR calc non Af Amer: 52 mL/min — ABNORMAL LOW (ref >60)
Glucose, Bld: 139 mg/dL — ABNORMAL HIGH (ref 70–99)
Potassium: 3 mmol/L — ABNORMAL LOW (ref 3.5–5.1)
Sodium: 142 mmol/L (ref 135–145)
Total Bilirubin: 1.5 mg/dL — ABNORMAL HIGH (ref 0.3–1.2)
Total Protein: 7.3 g/dL (ref 6.5–8.1)

## 2018-10-03 LAB — TROPONIN I: TROPONIN I: 0.04 ng/mL — AB (ref <0.03)

## 2018-10-03 LAB — INFLUENZA PANEL BY PCR (TYPE A & B)
Influenza A By PCR: NEGATIVE
Influenza B By PCR: NEGATIVE

## 2018-10-03 LAB — BRAIN NATRIURETIC PEPTIDE: B Natriuretic Peptide: 794 pg/mL — ABNORMAL HIGH (ref 0.0–100.0)

## 2018-10-03 MED ORDER — VANCOMYCIN HCL 10 G IV SOLR
1250.0000 mg | Freq: Once | INTRAVENOUS | Status: AC
  Administered 2018-10-04: 1250 mg via INTRAVENOUS
  Filled 2018-10-03: qty 1250

## 2018-10-03 MED ORDER — IPRATROPIUM-ALBUTEROL 0.5-2.5 (3) MG/3ML IN SOLN
3.0000 mL | Freq: Once | RESPIRATORY_TRACT | Status: AC
  Administered 2018-10-03: 3 mL via RESPIRATORY_TRACT
  Filled 2018-10-03: qty 3

## 2018-10-03 MED ORDER — LORAZEPAM 2 MG/ML IJ SOLN
0.5000 mg | Freq: Four times a day (QID) | INTRAMUSCULAR | Status: AC | PRN
  Administered 2018-10-04: 0.5 mg via INTRAVENOUS
  Filled 2018-10-03: qty 1

## 2018-10-03 MED ORDER — ESCITALOPRAM OXALATE 10 MG PO TABS
10.0000 mg | ORAL_TABLET | Freq: Every day | ORAL | Status: DC
  Administered 2018-10-05 – 2018-10-10 (×6): 10 mg via ORAL
  Filled 2018-10-03 (×7): qty 1

## 2018-10-03 MED ORDER — SODIUM CHLORIDE 0.9 % IV SOLN
1.0000 g | Freq: Once | INTRAVENOUS | Status: AC
  Administered 2018-10-03: 1 g via INTRAVENOUS
  Filled 2018-10-03: qty 1

## 2018-10-03 MED ORDER — NYSTATIN 100000 UNIT/GM EX POWD
Freq: Two times a day (BID) | CUTANEOUS | Status: DC
  Administered 2018-10-04 – 2018-10-10 (×12): via TOPICAL
  Filled 2018-10-03 (×2): qty 15

## 2018-10-03 NOTE — ED Triage Notes (Signed)
[facility] from home productive cough last night sore throat this AM hx COPD breathing diff all day speaking in complete sentences breathing tx before EMS breathing tx with EMS- 1 duoneb, 1 albuterol, 125 solumedrol  oxygen chronically at 2 L   Temp 98.3

## 2018-10-03 NOTE — ED Provider Notes (Addendum)
[facility] Emergency Department Provider Note   ____________________________________________   First MD Initiated Contact with Patient 10/03/18 1942     (approximate)  I have reviewed the triage vital signs and the nursing notes.   HISTORY  Chief Complaint Shortness of Breath    HPI Alan Ortega is a 85 y.o. male patient reports increasing shortness of breath for the last day or 2.  He is coughing up some whitish-yellowish thick phlegm.  He is not running a fever free.  Does not really have any chest pain or tightness.  He just is short of breath.  It is gradually getting worse.  I understand the patient has a history of COPD.  1 albuterol nebulizer at home EMS gave him 1 albuterol nebulizer and 1 DuoNeb and Solu-Medrol.   Past Medical History:  Diagnosis Date  . Cancer [facility])    Prostate  . Hypertension     There are no active problems to display for this patient.   Past Surgical History:  Procedure Laterality Date  . CHOLECYSTECTOMY    . COLONOSCOPY    . CORONARY ANGIOPLASTY    . CORONARY ARTERY BYPASS GRAFT    . EXCISION MASS LOWER EXTREMETIES Right 09/09/2018   Procedure: EXCISION MASS LOWER EXTREMETIES;  Surgeon: Benjamine Sprague, DO;  Location: [facility] ORS;  Service: General;  Laterality: Right;  . TONSILLECTOMY      Prior to Admission medications   Medication Sig Start Date End Date Taking? Authorizing Provider  acetaminophen (TYLENOL) 325 MG tablet Take 2 tablets (650 mg total) by mouth every 8 (eight) hours as needed for mild pain. 09/09/18 10/09/18  Benjamine Sprague, DO  aspirin EC 81 MG tablet Take 81 mg by mouth daily.    [provider]  escitalopram (LEXAPRO) 10 MG tablet Take 10 mg by mouth daily.    [provider]  HYDROcodone-acetaminophen (NORCO) 5-325 MG tablet Take 1 tablet by mouth every 6 (six) hours as needed for up to 3 doses for moderate pain. 09/09/18   Lysle Pearl, Isami, DO  ibuprofen (ADVIL,MOTRIN) 800 MG  tablet Take 1 tablet (800 mg total) by mouth every 8 (eight) hours as needed for mild pain or moderate pain. 09/09/18   Lysle Pearl, Isami, DO  simvastatin (ZOCOR) 20 MG tablet Take 20 mg by mouth daily.    [provider]    Allergies Patient has no known allergies.  History reviewed. No pertinent family history.  Social History Social History   Tobacco Use  . Smoking status: Never Smoker  . Smokeless tobacco: Never Used  Substance Use Topics  . Alcohol use: Never    Frequency: Never  . Drug use: Never    Review of Systems  Constitutional: No fever/chills Eyes: No visual changes. ENT: No sore throat. Cardiovascular: Denies chest pain. Respiratory:  shortness of breath. Gastrointestinal: No abdominal pain.  No nausea, no vomiting.  No diarrhea.  No constipation. Genitourinary: Negative for dysuria. Musculoskeletal: Negative for back pain. Skin: Negative for rash. Neurological: Negative for headaches, focal weakness    ____________________________________________   PHYSICAL EXAM:  VITAL SIGNS: ED Triage Vitals  Enc Vitals Group     BP 10/03/18 1946 (!) 148/101     Pulse Rate 10/03/18 1946 86     Resp 10/03/18 1946 18     Temp 10/03/18 1946 98 F (36.7 C)     Temp Source 10/03/18 1946 Oral     SpO2 10/03/18 1946 96 %     Weight --  Height --      Head Circumference --      Peak Flow --      Pain Score 10/03/18 1950 0     Pain Loc --      Pain Edu? --      Excl. in [place]? --     Constitutional: Alert and oriented.  Looks short of breath Eyes: Conjunctivae are normal.. Head: Atraumatic. Nose: No congestion/rhinnorhea. Mouth/Throat: Mucous membranes are moist.  Oropharynx non-erythematous. Neck: No stridor.   Cardiovascular: Normal rate, regular rhythm. Grossly normal heart sounds.  Good peripheral circulation. Respiratory: Normal respiratory effort.  No retractions. Lungs scattered wheezes and crackles Gastrointestinal: Soft and nontender. No  distention. No abdominal bruits. No CVA tenderness. Musculoskeletal: No lower extremity tenderness nor edema.  No joint effusions. Neurologic:  Normal speech and language. No gross focal neurologic deficits are appreciated. No gait instability. Skin: No rash appears intact  ____________________________________________   LABS (all labs ordered are listed, but only abnormal results are displayed)  Labs Reviewed  BRAIN NATRIURETIC PEPTIDE - Abnormal; Notable for the following components:      Result Value   B Natriuretic Peptide 794.0 (*)    All other components within normal limits  CBC WITH DIFFERENTIAL/PLATELET - Abnormal; Notable for the following components:   WBC 16.3 (*)    Neutro Abs 10.9 (*)    Monocytes Absolute 1.6 (*)    All other components within normal limits  CULTURE, BLOOD (ROUTINE X 2)  CULTURE, BLOOD (ROUTINE X 2)  INFLUENZA PANEL BY PCR (TYPE A & B)  COMPREHENSIVE METABOLIC PANEL  TROPONIN I   ____________________________________________  EKG KG read interpreted by me shows sinus tachycardia rate of 103 normal axis right bundle branch block.  EKG looks similar to one from August. _______________________________________  RADIOLOGY  ED MD interpretation: X-ray shows bibasilar infiltrates this is read by me  Official radiology report(s): No results found.  ____________________________________________   PROCEDURES  Procedure(s) performed:   Procedures  Critical Care critical care time half an hour.  This includes reevaluating the patient getting on BiPAP when he starts to get worse.  Talking to the hospitalist reviewing his studies and talking to the family.  ____________________________________________   INITIAL IMPRESSION / ASSESSMENT AND PLAN / ED COURSE  Patient was in the hospital last month.  He has what appears to be an infiltrate on his chest x-ray with an elevated white count.  Additionally his BNP is elevated.  We will get him on some  Maxipime oxygen some nebs for what appears to be the OPD as well.  He has a history of that.  Plan on getting him into the hospital.  His O2 sats are low even on his 2 L.  Got at least one instance of him moving around in the bed that I have seen with his O2 sats 88/89.  ----------------------------------------- 10:37 PM on 10/03/2018 -----------------------------------------  Patient begins getting restless.  His sats are dropping his heart rates going up he is also anxious.  I talked to him his heart rate comes down a little bit but still not much.  We will put him on BiPAP to see if this helps.  I put his oxygen up to 6 L and is pulse ox is reading 91% at this point.        ____________________________________________   FINAL CLINICAL IMPRESSION(S) / ED DIAGNOSES  Final diagnoses:  COPD exacerbation ([place])  Healthcare-associated pneumonia  Congestive heart failure, unspecified HF chronicity, unspecified  heart failure type [facility])     ED Discharge Orders    None       Note:  This document was prepared using Dragon voice recognition software and may include unintentional dictation errors.    Nena Polio, MD 10/03/18 2056    Nena Polio, MD 10/03/18 2238

## 2018-10-03 NOTE — H&P (Signed)
[place] [facility] [place] NAME: Alan Ortega    MR#:  [id number]  DATE OF BIRTH:  1934-02-20  DATE OF ADMISSION:  10/03/2018  PRIMARY CARE PHYSICIAN: [name], MD   REQUESTING/REFERRING PHYSICIAN: Cinda Quest, MD  CHIEF COMPLAINT:   Chief Complaint  Patient presents with  . Shortness of Breath    HISTORY OF PRESENT ILLNESS:  Alan Ortega  is a 85 y.o. male who presents with chief complaint as above.  Patient presents to the ED with progressive shortness of breath.  States that he first had some shortness of breath starting 3 days ago.  It is gotten progressively worse.  Today he presents tachypneic, with significant wheezing, has had productive cough at home.  He meets sepsis criteria in the ED and is found on imaging to have pneumonia.  He was initially stable on supplemental oxygen via nasal cannula, but decompensated here in the ED with progressive tachypnea and decreasing sats.  He was placed on BiPAP.  Hospitalist were called for admission  PAST MEDICAL HISTORY:   Past Medical History:  Diagnosis Date  . CAD (coronary artery disease)   . Cancer [facility])    Prostate  . COPD (chronic obstructive pulmonary disease) ([place])   . HLD (hyperlipidemia)   . Hypertension      PAST SURGICAL HISTORY:   Past Surgical History:  Procedure Laterality Date  . CHOLECYSTECTOMY    . COLONOSCOPY    . CORONARY ANGIOPLASTY    . CORONARY ARTERY BYPASS GRAFT    . EXCISION MASS LOWER EXTREMETIES Right 09/09/2018   Procedure: EXCISION MASS LOWER EXTREMETIES;  Surgeon: Benjamine Sprague, DO;  Location: [facility] ORS;  Service: General;  Laterality: Right;  . TONSILLECTOMY       SOCIAL HISTORY:   Social History   Tobacco Use  . Smoking status: Never Smoker  . Smokeless tobacco: Never Used  Substance Use Topics  . Alcohol use: Never    Frequency: Never     FAMILY HISTORY:   Family History  Problem Relation Age of Onset  . Heart disease  Mother   . Stroke Father   . Kidney disease Brother      DRUG ALLERGIES:  No Known Allergies  MEDICATIONS AT HOME:   Prior to Admission medications   Medication Sig Start Date End Date Taking? Authorizing Provider  acetaminophen (TYLENOL) 325 MG tablet Take 2 tablets (650 mg total) by mouth every 8 (eight) hours as needed for mild pain. 09/09/18 10/09/18  Benjamine Sprague, DO  aspirin EC 81 MG tablet Take 81 mg by mouth daily.    [provider]  escitalopram (LEXAPRO) 10 MG tablet Take 10 mg by mouth daily.    [provider]  HYDROcodone-acetaminophen (NORCO) 5-325 MG tablet Take 1 tablet by mouth every 6 (six) hours as needed for up to 3 doses for moderate pain. 09/09/18   Lysle Pearl, Isami, DO  ibuprofen (ADVIL,MOTRIN) 800 MG tablet Take 1 tablet (800 mg total) by mouth every 8 (eight) hours as needed for mild pain or moderate pain. 09/09/18   Lysle Pearl, Isami, DO  simvastatin (ZOCOR) 20 MG tablet Take 20 mg by mouth daily.    [provider]    REVIEW OF SYSTEMS:  Review of Systems  Constitutional: Negative for chills, fever, malaise/fatigue and weight loss.  HENT: Negative for ear pain, hearing loss and tinnitus.   Eyes: Negative for blurred vision, double vision, pain and redness.  Respiratory: Positive for cough, sputum  production, shortness of breath and wheezing. Negative for hemoptysis.   Cardiovascular: Negative for chest pain, palpitations, orthopnea and leg swelling.  Gastrointestinal: Negative for abdominal pain, constipation, diarrhea, nausea and vomiting.  Genitourinary: Negative for dysuria, frequency and hematuria.  Musculoskeletal: Negative for back pain, joint pain and neck pain.  Skin:       No acne, rash, or lesions  Neurological: Negative for dizziness, tremors, focal weakness and weakness.  Endo/Heme/Allergies: Negative for polydipsia. Does not bruise/bleed easily.  Psychiatric/Behavioral: Negative for depression. The patient is not  nervous/anxious and does not have insomnia.      VITAL SIGNS:   Vitals:   10/03/18 2100 10/03/18 2101 10/03/18 2130 10/03/18 2200  BP: (!) 137/94     Pulse: (!) 104 (!) 108 (!) 104 (!) 120  Resp: (!) 26 (!) 28 (!) 24 (!) 36  Temp:      TempSrc:      SpO2: (!) 88% 93% 93% 92%   Wt Readings from Last 3 Encounters:  09/09/18 64 kg  04/28/18 77.1 kg    PHYSICAL EXAMINATION:  Physical Exam  Vitals reviewed. Constitutional: He is oriented to person, place, and time. He appears well-developed and well-nourished. No distress.  HENT:  Head: Normocephalic and atraumatic.  Mouth/Throat: Oropharynx is clear and moist.  Eyes: Pupils are equal, round, and reactive to light. Conjunctivae and EOM are normal. No scleral icterus.  Neck: Normal range of motion. Neck supple. No JVD present. No thyromegaly present.  Cardiovascular: Regular rhythm and intact distal pulses. Exam reveals no gallop and no friction rub.  No murmur heard. tachycardic  Respiratory: He is in respiratory distress. He has wheezes. He has rales.  GI: Soft. Bowel sounds are normal. He exhibits no distension. There is no abdominal tenderness.  Musculoskeletal: Normal range of motion.        General: No edema.     Comments: No arthritis, no gout  Lymphadenopathy:    He has no cervical adenopathy.  Neurological: He is alert and oriented to person, place, and time. No cranial nerve deficit.  No dysarthria, no aphasia  Skin: Skin is warm and dry. No rash noted. No erythema.  Psychiatric: He has a normal mood and affect. His behavior is normal. Judgment and thought content normal.    LABORATORY PANEL:   CBC Recent Labs  Lab 10/03/18 1950  WBC 16.3*  HGB 14.5  HCT 45.5  PLT 209   ------------------------------------------------------------------------------------------------------------------  Chemistries  Recent Labs  Lab 10/03/18 2115  NA 142  K 3.0*  CL 107  CO2 23  GLUCOSE 139*  BUN 21  CREATININE  1.28*  CALCIUM 8.4*  AST 18  ALT 9  ALKPHOS 69  BILITOT 1.5*   ------------------------------------------------------------------------------------------------------------------  Cardiac Enzymes Recent Labs  Lab 10/03/18 2115  TROPONINI 0.04*   ------------------------------------------------------------------------------------------------------------------  RADIOLOGY:  Dg Chest 2 View  Result Date: 10/03/2018 CLINICAL DATA:  85 year old with acute onset of productive cough that began last night now associated with a sore throat. Current history of emphysema. EXAM: CHEST - 2 VIEW COMPARISON:  11/20/2014 and earlier, including CT chest 09/13/2013. FINDINGS: AP ERECT and LATERAL images were obtained. Prior sternotomy for CABG. Cardiac silhouette mildly to moderately enlarged, unchanged. Thoracic aorta atherosclerotic, unchanged. Hilar and mediastinal contours otherwise unremarkable. Severe emphysematous changes throughout both lungs and chronic interstitial fibrosis in the LOWER lungs, unchanged. Superimposed airspace consolidation in the LEFT LOWER LOBE. Pulmonary vascularity normal. Degenerative changes involving the thoracic spine. IMPRESSION: LEFT LOWER LOBE pneumonia superimposed upon  severe COPD/emphysema and chronic interstitial fibrosis. Stable cardiomegaly without pulmonary edema. Electronically Signed   By: Evangeline Dakin M.D.   On: 10/03/2018 20:54    EKG:   Orders placed or performed during the hospital encounter of 10/03/18  . ED EKG within 10 minutes  . ED EKG within 10 minutes  . EKG 12-Lead  . EKG 12-Lead    IMPRESSION AND PLAN:  Principal Problem:   Sepsis ([place]) -due to pneumonia as below, IV antibiotics initiated, lactic acid pending, blood pressure has been stable, cultures sent Active Problems:   CAP (community acquired pneumonia) -IV antibiotics as above, treatment for COPD as below, patient is currently on BiPAP, supportive treatment PRN   COPD with acute  exacerbation (H[facility]) -IV antibiotics as above, IV Solu-Medrol, patient is currently on BiPAP, continue home meds   Acute respiratory failure with hypoxia ([place]) -due to pneumonia and COPD as above, treatment as above   HTN (hypertension) -continue home dose antihypertensives   CAD (coronary artery disease) -continue home medications   HLD (hyperlipidemia) -continue home dose antilipid  Chart review performed and case discussed with ED provider. Labs, imaging and/or ECG reviewed by provider and discussed with patient/family. Management plans discussed with the patient and/or family.  DVT PROPHYLAXIS: SubQ lovenox   GI PROPHYLAXIS:  None  ADMISSION STATUS: Inpatient     CODE STATUS: Full  TOTAL CRITICAL CARE TIME TAKING CARE OF THIS PATIENT: 50 minutes.   Jannifer Franklin, Demontray Franta Hyndman 10/03/2018, 10:20 PM  [facility]  Office  [phone]  CC: Primary care physician; [name], MD  Note:  This document was prepared using Dragon voice recognition software and may include unintentional dictation errors.

## 2018-10-04 ENCOUNTER — Inpatient Hospital Stay: Payer: Medicare HMO

## 2018-10-04 DIAGNOSIS — J441 Chronic obstructive pulmonary disease with (acute) exacerbation: Secondary | ICD-10-CM

## 2018-10-04 DIAGNOSIS — J9601 Acute respiratory failure with hypoxia: Secondary | ICD-10-CM

## 2018-10-04 LAB — BLOOD CULTURE ID PANEL (REFLEXED)
Acinetobacter baumannii: NOT DETECTED
CARBAPENEM RESISTANCE: NOT DETECTED
Candida albicans: NOT DETECTED
Candida glabrata: NOT DETECTED
Candida krusei: NOT DETECTED
Candida parapsilosis: NOT DETECTED
Candida tropicalis: NOT DETECTED
ESCHERICHIA COLI: NOT DETECTED
Enterobacter cloacae complex: NOT DETECTED
Enterobacteriaceae species: NOT DETECTED
Enterococcus species: NOT DETECTED
Haemophilus influenzae: NOT DETECTED
KLEBSIELLA OXYTOCA: NOT DETECTED
Klebsiella pneumoniae: NOT DETECTED
Listeria monocytogenes: NOT DETECTED
Methicillin resistance: NOT DETECTED
Neisseria meningitidis: NOT DETECTED
Proteus species: NOT DETECTED
Pseudomonas aeruginosa: NOT DETECTED
Serratia marcescens: NOT DETECTED
Staphylococcus aureus (BCID): NOT DETECTED
Staphylococcus species: NOT DETECTED
Streptococcus agalactiae: NOT DETECTED
Streptococcus pneumoniae: NOT DETECTED
Streptococcus pyogenes: NOT DETECTED
Streptococcus species: NOT DETECTED
Vancomycin resistance: NOT DETECTED

## 2018-10-04 LAB — POTASSIUM: Potassium: 3.9 mmol/L (ref 3.5–5.1)

## 2018-10-04 LAB — BASIC METABOLIC PANEL
Anion gap: 14 (ref 5–15)
BUN: 22 mg/dL (ref 8–23)
CHLORIDE: 105 mmol/L (ref 98–111)
CO2: 21 mmol/L — ABNORMAL LOW (ref 22–32)
Calcium: 8.3 mg/dL — ABNORMAL LOW (ref 8.9–10.3)
Creatinine, Ser: 1.43 mg/dL — ABNORMAL HIGH (ref 0.61–1.24)
GFR calc Af Amer: 52 mL/min — ABNORMAL LOW (ref >60)
GFR calc non Af Amer: 45 mL/min — ABNORMAL LOW (ref >60)
Glucose, Bld: 218 mg/dL — ABNORMAL HIGH (ref 70–99)
Potassium: 3 mmol/L — ABNORMAL LOW (ref 3.5–5.1)
Sodium: 140 mmol/L (ref 135–145)

## 2018-10-04 LAB — CBC
HCT: 45.5 % (ref 39.0–52.0)
Hemoglobin: 14.3 g/dL (ref 13.0–17.0)
MCH: 31.2 pg (ref 26.0–34.0)
MCHC: 31.4 g/dL (ref 30.0–36.0)
MCV: 99.3 fL (ref 80.0–100.0)
Platelets: 208 10*3/uL (ref 150–400)
RBC: 4.58 MIL/uL (ref 4.22–5.81)
RDW: 13.2 % (ref 11.5–15.5)
WBC: 19.2 10*3/uL — ABNORMAL HIGH (ref 4.0–10.5)
nRBC: 0 % (ref 0.0–0.2)

## 2018-10-04 LAB — LACTIC ACID, PLASMA
Lactic Acid, Venous: 5 mmol/L (ref 0.5–1.9)
Lactic Acid, Venous: 2.6 mmol/L (ref 0.5–1.9)
Lactic Acid, Venous: 2.6 mmol/L (ref 0.5–1.9)
Lactic Acid, Venous: 1.9 mmol/L (ref 0.5–1.9)

## 2018-10-04 LAB — MRSA PCR SCREENING: MRSA by PCR: NEGATIVE

## 2018-10-04 LAB — TROPONIN I
TROPONIN I: 1.11 ng/mL — AB (ref <0.03)
Troponin I: 0.2 ng/mL (ref <0.03)
Troponin I: 0.84 ng/mL (ref <0.03)
Troponin I: 0.82 ng/mL (ref <0.03)

## 2018-10-04 LAB — PHOSPHORUS: Phosphorus: 2.8 mg/dL (ref 2.5–4.6)

## 2018-10-04 LAB — GLUCOSE, CAPILLARY: Glucose-Capillary: 202 mg/dL — ABNORMAL HIGH (ref 70–99)

## 2018-10-04 LAB — MAGNESIUM: MAGNESIUM: 1.8 mg/dL (ref 1.7–2.4)

## 2018-10-04 LAB — PROCALCITONIN: Procalcitonin: 0.13 ng/mL

## 2018-10-04 MED ORDER — SODIUM CHLORIDE 0.9 % IV SOLN
INTRAVENOUS | Status: DC
  Administered 2018-10-04: 06:00:00 via INTRAVENOUS

## 2018-10-04 MED ORDER — ENOXAPARIN SODIUM 40 MG/0.4ML ~~LOC~~ SOLN: 40.0000 mg | SUBCUTANEOUS | Status: DC

## 2018-10-04 MED ORDER — VANCOMYCIN HCL IN DEXTROSE 1-5 GM/200ML-% IV SOLN: 1000.0000 mg | INTRAVENOUS | Status: DC

## 2018-10-04 MED ORDER — IPRATROPIUM-ALBUTEROL 0.5-2.5 (3) MG/3ML IN SOLN
3.0000 mL | RESPIRATORY_TRACT | Status: DC
  Administered 2018-10-04 – 2018-10-09 (×29): 3 mL via RESPIRATORY_TRACT
  Filled 2018-10-04 (×29): qty 3

## 2018-10-04 MED ORDER — ENOXAPARIN SODIUM 40 MG/0.4ML ~~LOC~~ SOLN
40.0000 mg | SUBCUTANEOUS | Status: DC
  Administered 2018-10-04 – 2018-10-09 (×6): 40 mg via SUBCUTANEOUS
  Filled 2018-10-04 (×6): qty 0.4

## 2018-10-04 MED ORDER — ONDANSETRON HCL 4 MG PO TABS: 4.0000 mg | ORAL_TABLET | Freq: Four times a day (QID) | ORAL | Status: DC | PRN

## 2018-10-04 MED ORDER — ONDANSETRON HCL 4 MG/2ML IJ SOLN
4.0000 mg | Freq: Four times a day (QID) | INTRAMUSCULAR | Status: DC | PRN
  Administered 2018-10-05: 4 mg via INTRAVENOUS
  Filled 2018-10-04: qty 2

## 2018-10-04 MED ORDER — POTASSIUM CHLORIDE 10 MEQ/100ML IV SOLN
10.0000 meq | INTRAVENOUS | Status: AC
  Administered 2018-10-04 (×6): 10 meq via INTRAVENOUS
  Filled 2018-10-04 (×6): qty 100

## 2018-10-04 MED ORDER — SODIUM CHLORIDE 0.9 % IV BOLUS
1000.0000 mL | Freq: Once | INTRAVENOUS | Status: AC
  Administered 2018-10-04: 1000 mL via INTRAVENOUS

## 2018-10-04 MED ORDER — ACETAMINOPHEN 650 MG RE SUPP: 650.0000 mg | Freq: Four times a day (QID) | RECTAL | Status: DC | PRN

## 2018-10-04 MED ORDER — ASPIRIN EC 81 MG PO TBEC
81.0000 mg | DELAYED_RELEASE_TABLET | Freq: Every day | ORAL | Status: DC
  Administered 2018-10-05 – 2018-10-10 (×6): 81 mg via ORAL
  Filled 2018-10-04 (×6): qty 1

## 2018-10-04 MED ORDER — METHYLPREDNISOLONE SODIUM SUCC 125 MG IJ SOLR
60.0000 mg | Freq: Four times a day (QID) | INTRAMUSCULAR | Status: DC
  Administered 2018-10-04 (×2): 60 mg via INTRAVENOUS
  Filled 2018-10-04 (×2): qty 2

## 2018-10-04 MED ORDER — SODIUM CHLORIDE 0.9 % IV SOLN
2.0000 g | Freq: Two times a day (BID) | INTRAVENOUS | Status: DC
  Filled 2018-10-04 (×2): qty 2

## 2018-10-04 MED ORDER — MAGNESIUM SULFATE 2 GM/50ML IV SOLN
2.0000 g | Freq: Once | INTRAVENOUS | Status: AC
  Administered 2018-10-04: 2 g via INTRAVENOUS
  Filled 2018-10-04: qty 50

## 2018-10-04 MED ORDER — SODIUM CHLORIDE 0.9 % IV BOLUS
1000.0000 mL | INTRAVENOUS | Status: AC
  Administered 2018-10-04 (×2): 1000 mL via INTRAVENOUS

## 2018-10-04 MED ORDER — IPRATROPIUM-ALBUTEROL 0.5-2.5 (3) MG/3ML IN SOLN: 3.0000 mL | RESPIRATORY_TRACT | Status: DC | PRN

## 2018-10-04 MED ORDER — ACETAMINOPHEN 325 MG PO TABS: 650.0000 mg | ORAL_TABLET | Freq: Four times a day (QID) | ORAL | Status: DC | PRN

## 2018-10-04 MED ORDER — METHYLPREDNISOLONE SODIUM SUCC 40 MG IJ SOLR
40.0000 mg | Freq: Two times a day (BID) | INTRAMUSCULAR | Status: DC
  Administered 2018-10-04 – 2018-10-06 (×4): 40 mg via INTRAVENOUS
  Filled 2018-10-04 (×4): qty 1

## 2018-10-04 MED ORDER — SIMVASTATIN 20 MG PO TABS
20.0000 mg | ORAL_TABLET | Freq: Every day | ORAL | Status: DC
  Administered 2018-10-05 – 2018-10-10 (×6): 20 mg via ORAL
  Filled 2018-10-04 (×6): qty 1

## 2018-10-04 NOTE — ED Notes (Signed)
Assisted pt with use of urinal. Pt unable to void at this time. Scrotal redness and swelling noted. Pt given new depends and provided for comfort and safety. Will continue to assess.

## 2018-10-04 NOTE — Progress Notes (Signed)
Per Order inserted foley catheter. When catheter placed patient complaining of pain. Advised MD and per patient request take out. No urine noted. Removed per MD. Patient is having bright red blood coming out of his urethra. MD notified as this time no additional orders given.

## 2018-10-04 NOTE — Progress Notes (Signed)
Per Dr Mortimer Fries he does not want to trend troponin's at this time. He will consult cardiology.

## 2018-10-04 NOTE — ED Notes (Signed)
Called to give report to receiving RN

## 2018-10-04 NOTE — Progress Notes (Addendum)
Pharmacy Electrolyte Monitoring Consult:  Pharmacy consulted to assist in monitoring and replacing electrolytes in this 85 y.o. male admitted on 10/03/2018 with Shortness of Breath   Labs:  Sodium (mmol/L)  Date Value  10/04/2018 140  03/23/2012 141   Potassium (mmol/L)  Date Value  10/04/2018 3.0 (L)  03/23/2012 3.7   Magnesium (mg/dL)  Date Value  10/04/2018 1.8  03/05/2012 1.7 (L)   Phosphorus (mg/dL)  Date Value  10/04/2018 2.8   Calcium (mg/dL)  Date Value  10/04/2018 8.3 (L)   Calcium, Total (mg/dL)  Date Value  03/23/2012 8.6   Albumin (g/dL)  Date Value  10/03/2018 3.8  03/23/2012 3.6    Assessment/Plan: Patient ordered potassium 73mEq IV Q1hr x 6 doses this am. Per RN patient not making urine at this time. Will obtain follow up potassium at 1700.   Will recheck all electrolytes with am labs.   Pharmacy will continue to monitor and adjust per consult.   Izreal Kock L 10/04/2018 4:12 PM

## 2018-10-04 NOTE — ED Notes (Signed)
Called to give report to receiving RN.

## 2018-10-04 NOTE — Progress Notes (Signed)
Pharmacy Antibiotic Note  Alan Ortega is a 85 y.o. male admitted on 10/03/2018 with sepsis.  Pharmacy has been consulted for vancomycin and cefepime dosing.  Plan: DW 65 kg  Vd 42L kei 0.035 hr-1  T1/2 20 hours  Vancomycin 1000 mg IV Q 24 hrs. Goal AUC 400-550. Expected AUC: 670 SCr used: 1.34   Height: 5\' 7"  (170.2 cm) Weight: 142 lb 10.2 oz (64.7 kg) IBW/kg (Calculated) : 66.1  Temp (24hrs), Avg:98 F (36.7 C), Min:98 F (36.7 C), Max:98 F (36.7 C)  Recent Labs  Lab 10/03/18 1950 10/03/18 2115 10/04/18 0053  WBC 16.3*  --  19.2*  CREATININE  --  1.28* 1.43*  LATICACIDVEN  --   --  5.0*    Estimated Creatinine Clearance: 36.4 mL/min (A) (by C-G formula based on SCr of 1.43 mg/dL (H)).    No Known Allergies  Antimicrobials this admission:  vancomycin, cefepime  >>    >>   Dose adjustments this admission:   Microbiology results: 1/12 BCx: pendign 1/13 MRSA PCR: pending      1/12: UA pending 1/12 CXR: LLL pneumonia Thank you for allowing pharmacy to be a part of this patient's care.  Jaquanna Ballentine S 10/04/2018 2:58 AM

## 2018-10-04 NOTE — Progress Notes (Signed)
Pharmacy Electrolyte Monitoring Consult:  Pharmacy consulted to assist in monitoring and replacing electrolytes in this 85 y.o. male admitted on 10/03/2018 with Shortness of Breath   Labs:  Sodium (mmol/L)  Date Value  10/04/2018 140  03/23/2012 141   Potassium (mmol/L)  Date Value  10/04/2018 3.9  03/23/2012 3.7   Magnesium (mg/dL)  Date Value  10/04/2018 1.8  03/05/2012 1.7 (L)   Phosphorus (mg/dL)  Date Value  10/04/2018 2.8   Calcium (mg/dL)  Date Value  10/04/2018 8.3 (L)   Calcium, Total (mg/dL)  Date Value  03/23/2012 8.6   Albumin (g/dL)  Date Value  10/03/2018 3.8  03/23/2012 3.6    Assessment/Plan: 1700 k - 3.9.   Will recheck all electrolytes with am labs.   Pharmacy will continue to monitor and adjust per consult.   Forrest Moron, PharmD Clinical Pharmacist 10/04/2018 6:31 PM

## 2018-10-04 NOTE — Progress Notes (Signed)
Patient with signs of CHF Obtain Cardiology Consultation Obtain ECHO   ICU nurse has attempted foley catheter placement and it was very painful.  I have suggested that Urology needs to be consulted to place foley since we need to monitor urine output.  Patient does NOT want foley catheter at this time.

## 2018-10-04 NOTE — Progress Notes (Signed)
Patient has refused placement of foley catheter. Per Dr Mortimer Fries he will review patients labs in the am. At this time no further orders given.

## 2018-10-04 NOTE — Progress Notes (Signed)
Pt transported on Bipap from ER to CCU without incident.

## 2018-10-04 NOTE — Progress Notes (Signed)
Dr Mortimer Fries notified during rounds that patient has not has any urine output. At this time no new orders given except to stop ns 100 due to chest xray.

## 2018-10-04 NOTE — Consult Note (Signed)
PULMONARY / CRITICAL CARE MEDICINE  Name: Alan Ortega MRN: [id number] DOB: 04-16-1934    LOS: 1  Referring Provider: Dr Jannifer Franklin  Reason for Referral: Sepsis and pneumonia Brief patient description: 85 year old male admitted with pneumonia and sepsis secondary to pneumonia.  HPI: This is an 85 year old Caucasian male with a medical history as indicated below who presented to the ED via EMS from home with a productive cough and progressive shortness of breath that started 3 days ago.  He describes cough as productive of yellow sputum, associated with progressive dyspnea, and wheezing.  He reports chills but uncertain if he had a fever or not.  Symptoms progressively got worse hence patient decided to come to the emergency room for evaluation.  At the ED, he was wheezing, he was tachypneic and his chest x-ray showed left lower lobe infiltrate consistent with pneumonia and emphysematous changes.  Labs showed a lactic acid of 5, WBC of 19.2 troponin of 0.20 and a creatinine of 1.43 up from his baseline of 1.19.  He was placed on BiPAP, started on broad-spectrum antibiotics and admitted to the ICU He reports moderate improvement in his symptoms.  He denies chest pain, palpitations, dizziness and headache.  Past Medical History:  Diagnosis Date  . CAD (coronary artery disease)   . Cancer [facility])    Prostate  . COPD (chronic obstructive pulmonary disease) ([place])   . HLD (hyperlipidemia)   . Hypertension    Past Surgical History:  Procedure Laterality Date  . CHOLECYSTECTOMY    . COLONOSCOPY    . CORONARY ANGIOPLASTY    . CORONARY ARTERY BYPASS GRAFT    . EXCISION MASS LOWER EXTREMETIES Right 09/09/2018   Procedure: EXCISION MASS LOWER EXTREMETIES;  Surgeon: Benjamine Sprague, DO;  Location: [facility] ORS;  Service: General;  Laterality: Right;  . TONSILLECTOMY     Prior to Admission medications   Medication Sig Start Date End Date Taking? Authorizing Provider  amLODipine (NORVASC) 5 MG tablet Take  5 mg by mouth daily.   Yes [provider]  clopidogrel (PLAVIX) 75 MG tablet Take 75 mg by mouth daily.   Yes [provider]  donepezil (ARICEPT) 5 MG tablet Take 1 tablet (5 mg total) by mouth at bedtime. 05/17/18 06/26/18 Yes Sowles, Drue Stager, MD  empagliflozin (JARDIANCE) 25 MG TABS tablet Take 25 mg by mouth daily.   Yes [provider]  glycopyrrolate (ROBINUL) 1 MG tablet Take 1 mg by mouth 2 (two) times daily.   Yes [provider]  insulin aspart (NOVOLOG FLEXPEN) 100 UNIT/ML FlexPen Inject 12 Units into the skin 2 (two) times daily.   Yes [provider]  insulin aspart (NOVOLOG) 100 UNIT/ML FlexPen Inject 18 Units into the skin daily. At 1700   Yes [provider]  Insulin Degludec-Liraglutide (XULTOPHY) 100-3.6 UNIT-MG/ML SOPN Inject 50 Units into the skin daily.   Yes [provider]  levETIRAcetam (KEPPRA) 500 MG tablet Take 500 mg by mouth 2 (two) times daily.   Yes [provider]  lipase/protease/amylase (CREON) 12000 units CPEP capsule Take 6,000 Units by mouth 3 (three) times daily before meals.   Yes [provider]  lipase/protease/amylase (CREON) 12000 units CPEP capsule Take 3,000 Units by mouth at bedtime. With snack   Yes [provider]  lisinopril (PRINIVIL,ZESTRIL) 5 MG tablet Take 5 mg by mouth daily.   Yes [provider]  metoprolol succinate (TOPROL-XL) 25 MG 24 hr tablet Take 1 tablet (25 mg total) by mouth daily.  05/17/18  Yes Sowles, Drue Stager, MD  rosuvastatin (CRESTOR) 40 MG tablet Take 1 tablet (40 mg total) by mouth daily. 05/17/18 06/26/18 Yes Steele Sizer, MD  aspirin EC 81 MG tablet Take 81 mg by mouth daily.    [provider]  famotidine (PEPCID) 20 MG tablet Take 1 tablet (20 mg total) by mouth 2 (two) times daily. 05/17/18 06/16/18  Steele Sizer, MD  gabapentin (NEURONTIN) 300 MG capsule Take 1 capsule (300 mg total) by mouth 2 (two) times daily.  05/17/18 06/16/18  Steele Sizer, MD  insulin glargine (LANTUS) 100 UNIT/ML injection Inject 0.1 mLs (10 Units total) into the skin daily. 05/17/18 06/16/18  Steele Sizer, MD  lacosamide 100 MG TABS Take 1 tablet (100 mg total) by mouth 2 (two) times daily. Patient not taking: Reported on 06/26/2018 10/02/17   Fritzi Mandes, MD  promethazine (PHENERGAN) 12.5 MG tablet Take 1 tablet (12.5 mg total) by mouth every 6 (six) hours as needed for nausea or vomiting. Patient not taking: Reported on 06/26/2018 07/21/17   Stark Klein, MD  sertraline (ZOLOFT) 25 MG tablet Take 1 tablet (25 mg total) by mouth daily. Patient not taking: Reported on 06/26/2018 05/17/18   Steele Sizer, MD   Allergies No Known Allergies  Family History Family History  Problem Relation Age of Onset  . Heart disease Mother   . Stroke Father   . Kidney disease Brother    Social History  reports that he has never smoked. He has never used smokeless tobacco. He reports that he does not drink alcohol or use drugs.  Review Of Systems: Per HPI.  Unable to obtain a detailed review of systems as patient is on continuous BiPAP  VITAL SIGNS: BP 138/82   Pulse (!) 44   Temp 98 F (36.7 C) (Axillary)   Resp 17   Ht 5\' 7"  (1.702 m)   Wt 64.7 kg   SpO2 96%   BMI 22.34 kg/m   HEMODYNAMICS:    VENTILATOR SETTINGS:    INTAKE / OUTPUT: No intake/output data recorded.  PHYSICAL EXAMINATION: General: Chronically ill looking, still not able to speak in full sentences HEENT: PERRLA, trachea midline, no JVD Neuro: Alert and oriented x3 no focal deficits Cardiovascular: Apical pulse regular, S1-S2, no murmur regurg or gallop, +2 pulses bilaterally, no edema Lungs: Increased work of breathing, breath sounds diminished in all lung fields, expiratory wheezes in upper lung fields, breath sounds diminished in the bases Abdomen: Nondistended, normal bowel sounds in all 4 quadrants, palpation reveals no  organomegaly Musculoskeletal: Positive range of motion, no deformities Skin: Warm and dry  LABS:  BMET Recent Labs  Lab 10/03/18 2115 10/04/18 0053  NA 142 140  K 3.0* 3.0*  CL 107 105  CO2 23 21*  BUN 21 22  CREATININE 1.28* 1.43*  GLUCOSE 139* 218*    Electrolytes Recent Labs  Lab 10/03/18 2115 10/04/18 0053  CALCIUM 8.4* 8.3*  MG  --  1.8  PHOS  --  2.8    CBC Recent Labs  Lab 10/03/18 1950 10/04/18 0053  WBC 16.3* 19.2*  HGB 14.5 14.3  HCT 45.5 45.5  PLT 209 208    Coag's No results for input(s): APTT, INR in the last 168 hours.  Sepsis Markers Recent Labs  Lab 10/04/18 0053  LATICACIDVEN 5.0*  PROCALCITON 0.13    ABG No results for input(s): PHART, PCO2ART, PO2ART in the last 168 hours.  Liver Enzymes Recent Labs  Lab 10/03/18 2115  AST 18  ALT 9  ALKPHOS 69  BILITOT 1.5*  ALBUMIN 3.8    Cardiac Enzymes Recent Labs  Lab 10/03/18 2115 10/04/18 0053  TROPONINI 0.04* 0.20*    Glucose Recent Labs  Lab 10/04/18 0049  GLUCAP 202*    Imaging Dg Chest 2 View  Result Date: 10/03/2018 CLINICAL DATA:  85 year old with acute onset of productive cough that began last night now associated with a sore throat. Current history of emphysema. EXAM: CHEST - 2 VIEW COMPARISON:  11/20/2014 and earlier, including CT chest 09/13/2013. FINDINGS: AP ERECT and LATERAL images were obtained. Prior sternotomy for CABG. Cardiac silhouette mildly to moderately enlarged, unchanged. Thoracic aorta atherosclerotic, unchanged. Hilar and mediastinal contours otherwise unremarkable. Severe emphysematous changes throughout both lungs and chronic interstitial fibrosis in the LOWER lungs, unchanged. Superimposed airspace consolidation in the LEFT LOWER LOBE. Pulmonary vascularity normal. Degenerative changes involving the thoracic spine. IMPRESSION: LEFT LOWER LOBE pneumonia superimposed upon severe COPD/emphysema and chronic interstitial fibrosis. Stable cardiomegaly  without pulmonary edema. Electronically Signed   By: Evangeline Dakin M.D.   On: 10/03/2018 20:54    CULTURES: Blood cultures x2  ANTIBIOTICS: Cefepime Vancomycin  SIGNIFICANT EVENTS: 10/03/2018: Admitted  LINES/TUBES: Peripheral IVs  DISCUSSION: 85 year old male admitted with left lower lobe pneumonia, sepsis secondary to pneumonia and acute hypoxic respiratory failure secondary to pneumonia.  ASSESSMENT  Acute hypoxic respiratory failure Sepsis secondary to pneumonia HCAP Lactic acidosis Acute COPD exacerbation History of prostate cancer History of hypertension, CAD, and hyperlipidemia  PLAN Hemodynamic monitoring per ICU protocol Continuous BiPAP and titrate to nasal cannula as tolerated Antibiotics as above Follow-up blood cultures Nebulized bronchodilators IV steroids Normal saline 1 L bolus x2 and relay with continuous infusion at 100 cc/h Monitor for urinary retention due to history of prostate cancer and reported difficulty urinating Relay all home medications  Best Practice: Code Status: Full full code Diet: N.p.o. with meds and sips while on BiPAP GI prophylaxis: Not indicated VTE prophylaxis: Subcu Lovenox  FAMILY  - Updates: No family at bedside.  Will update when available  Harmony Sandell [facility] ANP-BC Pulmonary and Critical Care Medicine [facility] Pager [phone] or [phone]  NB: This document was prepared using Dragon voice recognition software and may include unintentional dictation errors.    10/04/2018, 2:12 AM

## 2018-10-04 NOTE — Progress Notes (Signed)
eLink Physician-Brief Progress Note Patient Name: Alan Ortega DOB: [birth year] MRN: [id number]   Date of Service  10/04/2018  HPI/Events of Note  85 yo male with PMH of IPF and COPD. Presents with pneumonia and AECOPD requiring BiPAP. Being admitted to a stepdown bed. PCCM asked to assume care. VSS.  eICU Interventions  No new orders.      Intervention Category Evaluation Type: New Patient Evaluation  Lysle Dingwall 10/04/2018, 12:53 AM

## 2018-10-04 NOTE — Progress Notes (Signed)
Patient having shortness of breath. Placed back on bi-pap. Breathing treatment ordered, ativan and chest xray. Dr. Mortimer Fries notified of lactic acid 2.6. MD will place orders for urology. At this point no urine output.

## 2018-10-04 NOTE — Progress Notes (Signed)
Dr Mortimer Fries notified of patients troponin level of 1.11. Also notified that patient has not had any urine output for shift. At this time no new orders given. Patient is not having any chest pain, pressure or shortness of breath.

## 2018-10-04 NOTE — Progress Notes (Signed)
[place] [facility] [place] NAME: Alan Ortega    MR#:  [id number]  DATE OF BIRTH:  1933/12/30  SUBJECTIVE:  CHIEF COMPLAINT: Patient was short of breath, not comfortable with BiPAP Less than 100 mL urine output as reported by the nurse after 3 L of fluids No family members at bedside  REVIEW OF SYSTEMS:  Limited as patient is short of breath and on BiPAP   cONSTITUTIONAL: Reports fatigue or weakness.   RESPIRATORY: has shortness of breath, wheezing, denies hemoptysis.  CARDIOVASCULAR: No chest pain, orthopnea, edema.  GASTROINTESTINAL: No nausea, vomiting, diarrhea or abdominal pain.  PSYCHIATRY: Anxious  DRUG ALLERGIES:  No Known Allergies  VITALS:  Blood pressure 113/74, pulse 78, temperature 97.6 F (36.4 C), resp. rate (!) 21, height 5\' 7"  (1.702 m), weight 64.7 kg, SpO2 98 %.  PHYSICAL EXAMINATION:  GENERAL:  85 y.o.-year-old patient lying in the bed with no acute distress.  EYES: Pupils equal, round, reactive to light and accommodation. No scleral icterus. Extraocular muscles intact.  HEENT: Head atraumatic, normocephalic. Oropharynx and nasopharynx clear.  NECK:  Supple, no jugular venous distention. LUNGS: Diminished breath sounds bilaterally, no wheezing, rales,rhonchi or crepitation.  Positive use of accessory muscles of respiration.  CARDIOVASCULAR: S1, S2 normal. No murmurs, rubs, or gallops.  ABDOMEN: Soft, nontender, nondistended. Bowel sounds present.  EXTREMITIES: No pedal edema, cyanosis, or clubbing.  NEUROLOGIC: Awake, alert and oriented x3 sensation intact. Gait not checked.  PSYCHIATRIC: The patient is alert and oriented x 3.  SKIN: No obvious rash, lesion, or ulcer.    LABORATORY PANEL:   CBC Recent Labs  Lab 10/04/18 0053  WBC 19.2*  HGB 14.3  HCT 45.5  PLT 208   ------------------------------------------------------------------------------------------------------------------  Chemistries   Recent Labs  Lab 10/03/18 2115 10/04/18 0053  NA 142 140  K 3.0* 3.0*  CL 107 105  CO2 23 21*  GLUCOSE 139* 218*  BUN 21 22  CREATININE 1.28* 1.43*  CALCIUM 8.4* 8.3*  MG  --  1.8  AST 18  --   ALT 9  --   ALKPHOS 69  --   BILITOT 1.5*  --    ------------------------------------------------------------------------------------------------------------------  Cardiac Enzymes Recent Labs  Lab 10/04/18 1242  TROPONINI 1.11*   ------------------------------------------------------------------------------------------------------------------  RADIOLOGY:  Dg Chest 2 View  Result Date: 10/03/2018 CLINICAL DATA:  85 year old with acute onset of productive cough that began last night now associated with a sore throat. Current history of emphysema. EXAM: CHEST - 2 VIEW COMPARISON:  11/20/2014 and earlier, including CT chest 09/13/2013. FINDINGS: AP ERECT and LATERAL images were obtained. Prior sternotomy for CABG. Cardiac silhouette mildly to moderately enlarged, unchanged. Thoracic aorta atherosclerotic, unchanged. Hilar and mediastinal contours otherwise unremarkable. Severe emphysematous changes throughout both lungs and chronic interstitial fibrosis in the LOWER lungs, unchanged. Superimposed airspace consolidation in the LEFT LOWER LOBE. Pulmonary vascularity normal. Degenerative changes involving the thoracic spine. IMPRESSION: LEFT LOWER LOBE pneumonia superimposed upon severe COPD/emphysema and chronic interstitial fibrosis. Stable cardiomegaly without pulmonary edema. Electronically Signed   By: Evangeline Dakin M.D.   On: 10/03/2018 20:54   Dg Chest Port 1 View  Result Date: 10/04/2018 CLINICAL DATA:  Shortness of breath. EXAM: PORTABLE CHEST 1 VIEW COMPARISON:  10/03/2018.  11/20/2014. FINDINGS: Prior CABG. Cardiomegaly. Diffuse bilateral pulmonary interstitial prominence with Kerley B-lines. Small bilateral pleural effusions. Findings consistent with CHF. Underlying chronic  interstitial lung disease may be present. Bibasilar pneumonia can not be excluded. No pneumothorax. IMPRESSION:  Prior CABG. Cardiomegaly with bilateral pulmonary interstitial prominence with Kerley B-lines. Small bilateral pleural effusions. These findings suggest CHF. Underlying chronic interstitial lung disease can not be excluded. Bibasilar pneumonia can not be excluded. Electronically Signed   By: Marcello Moores  Register   On: 10/04/2018 09:12    EKG:   Orders placed or performed during the hospital encounter of 10/03/18  . ED EKG within 10 minutes  . ED EKG within 10 minutes  . EKG 12-Lead  . EKG 12-Lead  . EKG    ASSESSMENT AND PLAN:    Acute respiratory failure with hypoxia On BiPAP IV Solu-Medrol, bronchodilator treatments and IV antibiotics  Sepsis (HCC) -due to pneumonia  IV antibiotics initiated, lactic acid pending,  Follow-up on the cultures     CAP (community acquired pneumonia) -IV antibiotics as above,supportive treatment PRN    COPD with acute exacerbation (HCC) -IV antibiotics as above, IV Solu-Medrol, patient is currently on BiPAP, continue home meds  Hematuria with oliguria Monitor hemoglobin hematocrit closely Would consider urology consult and renal ultrasound discussed with Dr. Mortimer Fries he will order if needed, care per ICU attending Hydrate with IV fluids       HTN (hypertension) -continue home dose antihypertensives     All the records are reviewed and case discussed with Care Management/Social Workerr. Management plans discussed with the patient, RN and Dr. Mortimer Fries and they are in agreement.  CODE STATUS: fc  TOTAL TIME TAKING CARE OF THIS PATIENT: 35 minutes.   POSSIBLE D/C IN 4  DAYS, DEPENDING ON CLINICAL CONDITION.  Note: This dictation was prepared with Dragon dictation along with smaller phrase technology. Any transcriptional errors that result from this process are unintentional.   [name] M.D on 10/04/2018 at 3:07 PM  Between 7am to 6pm - Pager  - [phone] After 6pm go to [email or web address] - password [email or web address] [facility]  Office  [phone]  CC: Primary care physician; [name], MD

## 2018-10-05 ENCOUNTER — Inpatient Hospital Stay
Admit: 2018-10-05 | Discharge: 2018-10-05 | Disposition: A | Discharge status: Home / Self Care | Payer: Medicare HMO | Attending: Internal Medicine | Admitting: Internal Medicine

## 2018-10-05 LAB — BASIC METABOLIC PANEL
ANION GAP: 8 (ref 5–15)
BUN: 27 mg/dL — ABNORMAL HIGH (ref 8–23)
CALCIUM: 8 mg/dL — AB (ref 8.9–10.3)
CO2: 21 mmol/L — ABNORMAL LOW (ref 22–32)
Chloride: 112 mmol/L — ABNORMAL HIGH (ref 98–111)
Creatinine, Ser: 1.15 mg/dL (ref 0.61–1.24)
GFR calc Af Amer: >60 mL/min (ref >60)
GFR calc non Af Amer: 59 mL/min — ABNORMAL LOW (ref >60)
Glucose, Bld: 124 mg/dL — ABNORMAL HIGH (ref 70–99)
Potassium: 3.7 mmol/L (ref 3.5–5.1)
Sodium: 141 mmol/L (ref 135–145)

## 2018-10-05 LAB — ECHOCARDIOGRAM COMPLETE
Height: 67 in
Weight: 2282.2 oz

## 2018-10-05 LAB — PROCALCITONIN: Procalcitonin: 0.28 ng/mL

## 2018-10-05 LAB — MAGNESIUM: Magnesium: 2.3 mg/dL (ref 1.7–2.4)

## 2018-10-05 LAB — PHOSPHORUS: Phosphorus: 3.4 mg/dL (ref 2.5–4.6)

## 2018-10-05 MED ORDER — SODIUM CHLORIDE 0.9 % IV SOLN
2.0000 g | INTRAVENOUS | Status: DC
  Administered 2018-10-05: 2 g via INTRAVENOUS
  Filled 2018-10-05: qty 2
  Filled 2018-10-05: qty 20

## 2018-10-05 MED ORDER — ALPRAZOLAM 0.5 MG PO TABS
0.5000 mg | ORAL_TABLET | Freq: Three times a day (TID) | ORAL | Status: DC | PRN
  Administered 2018-10-05: 0.5 mg via ORAL
  Filled 2018-10-05: qty 1

## 2018-10-05 MED ORDER — LISINOPRIL 5 MG PO TABS
5.0000 mg | ORAL_TABLET | Freq: Every day | ORAL | Status: DC
  Administered 2018-10-05 – 2018-10-10 (×5): 5 mg via ORAL
  Filled 2018-10-05 (×5): qty 1

## 2018-10-05 MED ORDER — CARVEDILOL 3.125 MG PO TABS
3.1250 mg | ORAL_TABLET | Freq: Two times a day (BID) | ORAL | Status: DC
  Administered 2018-10-05 – 2018-10-10 (×8): 3.125 mg via ORAL
  Filled 2018-10-05 (×10): qty 1

## 2018-10-05 MED ORDER — SODIUM CHLORIDE 0.9 % IV SOLN
1.0000 g | INTRAVENOUS | Status: DC
  Filled 2018-10-05: qty 10

## 2018-10-05 MED ORDER — CEFTRIAXONE SODIUM 1 G IJ SOLR: 1.0000 g | INTRAMUSCULAR | Status: DC

## 2018-10-05 NOTE — Consult Note (Signed)
[facility] Cardiology  CARDIOLOGY CONSULT NOTE  Patient ID: Alan Ortega MRN: [id number] DOB/AGE: 1934-09-15 85 y.o.  Admit date: 10/03/2018 Referring Physician Kasa Primary Physician [facility] Primary Cardiologist Paraschos Reason for Consultation Elevated troponin  HPI: 85 year old male referred for evaluation of elevated troponin.  Patient has a history of coronary artery disease, status post CABG x3 in 2002, essential hypertension, mild ischemic cardiomyopathy, AAA, dementia, COPD, and hyperlipidemia.  The patient presented to [facility] ER for a 3-day history of progressive shortness of breath.  In the ER, the patient was noted to be tachypneic with hypoxia, requiring BiPAP, with elevated white count of 19.2, meeting sepsis criteria and started on empiric antibiotics, IV Solu-Medrol, with chest x-ray positive for pneumonia.  Admission labs notable for troponin 0.04, 0.20, 0.84, 1.11, followed by 0.82.  ECG revealed sinus tachycardia at a rate of 103 bpm with occasional PVCs with right bundle branch block without evidence of ischemia.  The patient had poor urine output with hematuria, with painful attempt at Foley catheter placement, pending urology consult. The patient denies any chest pain, and states he has "never" had any. He reports that he is breathing better now. He denies palpitations or heart racing. He is quite tearful during this interview due to the passing of his wife one year ago.  Review of systems complete and found to be negative unless listed above     Past Medical History:  Diagnosis Date  . CAD (coronary artery disease)   . Cancer [facility])    Prostate  . COPD (chronic obstructive pulmonary disease) ([place])   . HLD (hyperlipidemia)   . Hypertension     Past Surgical History:  Procedure Laterality Date  . CHOLECYSTECTOMY    . COLONOSCOPY    . CORONARY ANGIOPLASTY    . CORONARY ARTERY BYPASS GRAFT    . EXCISION MASS LOWER EXTREMETIES Right 09/09/2018   Procedure: EXCISION MASS  LOWER EXTREMETIES;  Surgeon: Benjamine Sprague, DO;  Location: [facility] ORS;  Service: General;  Laterality: Right;  . TONSILLECTOMY      Medications Prior to Admission  Medication Sig Dispense Refill Last Dose  . aspirin EC 81 MG tablet Take 81 mg by mouth at bedtime.    Past Week at Unknown time  . escitalopram (LEXAPRO) 10 MG tablet Take 10 mg by mouth at bedtime.    Past Week at Unknown time  . simvastatin (ZOCOR) 20 MG tablet Take 20 mg by mouth at bedtime.    Past Week at Unknown time   Social History   Socioeconomic History  . Marital status: Widowed    Spouse name: Not on file  . Number of children: Not on file  . Years of education: Not on file  . Highest education level: Not on file  Occupational History  . Not on file  Social Needs  . Financial resource strain: Not on file  . Food insecurity:    Worry: Not on file    Inability: Not on file  . Transportation needs:    Medical: Not on file    Non-medical: Not on file  Tobacco Use  . Smoking status: Never Smoker  . Smokeless tobacco: Never Used  Substance and Sexual Activity  . Alcohol use: Never    Frequency: Never  . Drug use: Never  . Sexual activity: Not Currently  Lifestyle  . Physical activity:    Days per week: Not on file    Minutes per session: Not on file  . Stress: Not on file  Relationships  . Social connections:    Talks on phone: Not on file    Gets together: Not on file    Attends religious service: Not on file    Active member of club or organization: Not on file    Attends meetings of clubs or organizations: Not on file    Relationship status: Not on file  . Intimate partner violence:    Fear of current or ex partner: Not on file    Emotionally abused: Not on file    Physically abused: Not on file    Forced sexual activity: Not on file  Other Topics Concern  . Not on file  Social History Narrative  . Not on file    Family History  Problem Relation Age of Onset  . Heart disease Mother   .  Stroke Father   . Kidney disease Brother       Review of systems complete and found to be negative unless listed above      PHYSICAL EXAM  General: Well developed, well nourished, in no acute distress, tearful HEENT:  Normocephalic and atramatic Neck:  No JVD.  Lungs: Diminished bibasilar breath sounds. Normal effort of breathing on supplemental oxygen Heart: Tachycardic . Normal S1 and S2 without gallops or murmurs.  Abdomen: nondistended  Msk:  Back normal, normal gait. Normal strength and tone for age. Extremities: No clubbing, cyanosis or edema.   Neuro: Alert and oriented X 3.   Labs:   Lab Results  Component Value Date   WBC 19.2 (H) 10/04/2018   HGB 14.3 10/04/2018   HCT 45.5 10/04/2018   MCV 99.3 10/04/2018   PLT 208 10/04/2018    Recent Labs  Lab 10/03/18 2115  10/05/18 0416  NA 142   < > 141  K 3.0*   < > 3.7  CL 107   < > 112*  CO2 23   < > 21*  BUN 21   < > 27*  CREATININE 1.28*   < > 1.15  CALCIUM 8.4*   < > 8.0*  PROT 7.3  --   --   BILITOT 1.5*  --   --   ALKPHOS 69  --   --   ALT 9  --   --   AST 18  --   --   GLUCOSE 139*   < > 124*   < > = values in this interval not displayed.   Lab Results  Component Value Date   CKTOTAL 232 04/28/2018   CKMB 1.1 03/04/2012   TROPONINI 0.82 (HH) 10/04/2018    Lab Results  Component Value Date   CHOL 116 03/05/2012   CHOL 168 01/06/2012   Lab Results  Component Value Date   HDL 29 (L) 03/05/2012   HDL 32 (L) 01/06/2012   Lab Results  Component Value Date   LDLCALC 64 03/05/2012   LDLCALC 115 (H) 01/06/2012   Lab Results  Component Value Date   TRIG 116 03/05/2012   TRIG 107 01/06/2012   TRIG 225 (H) 01/05/2012   No results found for: CHOLHDL No results found for: LDLDIRECT    Radiology: Dg Chest 2 View  Result Date: 10/03/2018 CLINICAL DATA:  85 year old with acute onset of productive cough that began last night now associated with a sore throat. Current history of emphysema. EXAM:  CHEST - 2 VIEW COMPARISON:  11/20/2014 and earlier, including CT chest 09/13/2013. FINDINGS: AP ERECT and LATERAL images were obtained. Prior sternotomy for CABG.  Cardiac silhouette mildly to moderately enlarged, unchanged. Thoracic aorta atherosclerotic, unchanged. Hilar and mediastinal contours otherwise unremarkable. Severe emphysematous changes throughout both lungs and chronic interstitial fibrosis in the LOWER lungs, unchanged. Superimposed airspace consolidation in the LEFT LOWER LOBE. Pulmonary vascularity normal. Degenerative changes involving the thoracic spine. IMPRESSION: LEFT LOWER LOBE pneumonia superimposed upon severe COPD/emphysema and chronic interstitial fibrosis. Stable cardiomegaly without pulmonary edema. Electronically Signed   By: Evangeline Dakin M.D.   On: 10/03/2018 20:54   Dg Chest Port 1 View  Result Date: 10/04/2018 CLINICAL DATA:  Shortness of breath. EXAM: PORTABLE CHEST 1 VIEW COMPARISON:  10/03/2018.  11/20/2014. FINDINGS: Prior CABG. Cardiomegaly. Diffuse bilateral pulmonary interstitial prominence with Kerley B-lines. Small bilateral pleural effusions. Findings consistent with CHF. Underlying chronic interstitial lung disease may be present. Bibasilar pneumonia can not be excluded. No pneumothorax. IMPRESSION: Prior CABG. Cardiomegaly with bilateral pulmonary interstitial prominence with Kerley B-lines. Small bilateral pleural effusions. These findings suggest CHF. Underlying chronic interstitial lung disease can not be excluded. Bibasilar pneumonia can not be excluded. Electronically Signed   By: Marcello Moores  Register   On: 10/04/2018 09:12    EKG: sinus tachycardia  ASSESSMENT AND PLAN:  1.  Elevated troponin without peak or trough in the setting of acute respiratory failure with hypoxia secondary to pneumonia and COPD exacerbation, currently without chest pain. Troponin 0.04, 0.20, 0.84, 1.11, followed by 0.82.  ECG revealed sinus tachycardia at a rate of 103 bpm with  occasional PVCs with right bundle branch block without evidence of ischemia. Consistent with demand supply ischemia. 2.  Acute respiratory failure with hypoxia secondary to community-acquired pneumonia and COPD exacerbation, receiving IV antibiotics, IV Solu-Medrol, and BiPAP 3.  Hematuria with oliguria, pending urology consult 4.  Hypertension 5.  Hyperlipidemia, on simvastatin  Recommendations: 1.  Defer stress test or cardiac catheterization at this time in the absence of chest pain, in the setting of acute respiratory failure and sepsis. 2.  Obtain 2D echocardiogram 3.  Further recommendations pending results of echocardiogram and patient's initial course   Signed: Clabe Seal PA-C 10/05/2018, 9:05 AM

## 2018-10-05 NOTE — Progress Notes (Signed)
Pharmacy Electrolyte Monitoring Consult:  Pharmacy consulted to assist in monitoring and replacing electrolytes in this 85 y.o. male admitted on 10/03/2018 with Shortness of Breath   Labs:  Sodium (mmol/L)  Date Value  10/05/2018 141  03/23/2012 141   Potassium (mmol/L)  Date Value  10/05/2018 3.7  03/23/2012 3.7   Magnesium (mg/dL)  Date Value  10/05/2018 2.3  03/05/2012 1.7 (L)   Phosphorus (mg/dL)  Date Value  10/05/2018 3.4   Calcium (mg/dL)  Date Value  10/05/2018 8.0 (L)   Calcium, Total (mg/dL)  Date Value  03/23/2012 8.6   Albumin (g/dL)  Date Value  10/03/2018 3.8  03/23/2012 3.6    Assessment/Plan: No replacement warranted at this time.   Will recheck all electrolytes with am labs.   Pharmacy will continue to monitor and adjust per consult.   Sondos Wolfman L 10/05/2018 3:33 PM

## 2018-10-05 NOTE — Progress Notes (Signed)
Alan Ortega NAME: Alan Ortega    MR#:  [id number]  DATE OF BIRTH:  1934-02-15  SUBJECTIVE:  CHIEF COMPLAINT: Patient is doing better off BiPAP.  Patient feels better than yesterday Daughter and son-in-law at bedside.  Family is requesting to check PSA  REVIEW OF SYSTEMS:  Limited as patient is short of breath and on BiPAP   cONSTITUTIONAL: Reports fatigue or weakness.   RESPIRATORY: has shortness of breath, wheezing, denies hemoptysis.  CARDIOVASCULAR: No chest pain, orthopnea, edema.  GASTROINTESTINAL: No nausea, vomiting, diarrhea or abdominal pain.  PSYCHIATRY: Anxious  DRUG ALLERGIES:  No Known Allergies  VITALS:  Blood pressure (!) 122/96, pulse 99, temperature (!) 97 F (36.1 C), temperature source Axillary, resp. rate 19, height 5\' 7"  (1.702 m), weight 64.7 kg, SpO2 95 %.  PHYSICAL EXAMINATION:  GENERAL:  85 y.o.-year-old patient lying in the bed with no acute distress.  EYES: Pupils equal, round, reactive to light and accommodation. No scleral icterus. Extraocular muscles intact.  HEENT: Head atraumatic, normocephalic. Oropharynx and nasopharynx clear.  NECK:  Supple, no jugular venous distention. LUNGS: Diminished breath sounds bilaterally, no wheezing, positive rales,rhonchi or crepitation.  No use of accessory muscles of respiration.  CARDIOVASCULAR: S1, S2 normal. No murmurs, rubs, or gallops.  ABDOMEN: Soft, nontender, nondistended. Bowel sounds present.  EXTREMITIES: No pedal edema, cyanosis, or clubbing.  NEUROLOGIC: Awake, alert and oriented x3 sensation intact. Gait not checked.  PSYCHIATRIC: The patient is alert and oriented x 3.  SKIN: No obvious rash, lesion, or ulcer.    LABORATORY PANEL:   CBC Recent Labs  Lab 10/04/18 0053  WBC 19.2*  HGB 14.3  HCT 45.5  PLT 208    ------------------------------------------------------------------------------------------------------------------  Chemistries  Recent Labs  Lab 10/03/18 2115  10/05/18 0416  NA 142   < > 141  K 3.0*   < > 3.7  CL 107   < > 112*  CO2 23   < > 21*  GLUCOSE 139*   < > 124*  BUN 21   < > 27*  CREATININE 1.28*   < > 1.15  CALCIUM 8.4*   < > 8.0*  MG  --    < > 2.3  AST 18  --   --   ALT 9  --   --   ALKPHOS 69  --   --   BILITOT 1.5*  --   --    < > = values in this interval not displayed.   ------------------------------------------------------------------------------------------------------------------  Cardiac Enzymes Recent Labs  Lab 10/04/18 2213  TROPONINI 0.82*   ------------------------------------------------------------------------------------------------------------------  RADIOLOGY:  Dg Chest 2 View  Result Date: 10/03/2018 CLINICAL DATA:  85 year old with acute onset of productive cough that began last night now associated with a sore throat. Current history of emphysema. EXAM: CHEST - 2 VIEW COMPARISON:  11/20/2014 and earlier, including CT chest 09/13/2013. FINDINGS: AP ERECT and LATERAL images were obtained. Prior sternotomy for CABG. Cardiac silhouette mildly to moderately enlarged, unchanged. Thoracic aorta atherosclerotic, unchanged. Hilar and mediastinal contours otherwise unremarkable. Severe emphysematous changes throughout both lungs and chronic interstitial fibrosis in the LOWER lungs, unchanged. Superimposed airspace consolidation in the LEFT LOWER LOBE. Pulmonary vascularity normal. Degenerative changes involving the thoracic spine. IMPRESSION: LEFT LOWER LOBE pneumonia superimposed upon severe COPD/emphysema and chronic interstitial fibrosis. Stable cardiomegaly without pulmonary edema. Electronically Signed   By: Alan Dakin M.D.   On: 10/03/2018 20:54   Dg Chest Port 1  View  Result Date: 10/04/2018 CLINICAL DATA:  Shortness of breath. EXAM:  PORTABLE CHEST 1 VIEW COMPARISON:  10/03/2018.  11/20/2014. FINDINGS: Prior CABG. Cardiomegaly. Diffuse bilateral pulmonary interstitial prominence with Kerley B-lines. Small bilateral pleural effusions. Findings consistent with CHF. Underlying chronic interstitial lung disease may be present. Bibasilar pneumonia can not be excluded. No pneumothorax. IMPRESSION: Prior CABG. Cardiomegaly with bilateral pulmonary interstitial prominence with Kerley B-lines. Small bilateral pleural effusions. These findings suggest CHF. Underlying chronic interstitial lung disease can not be excluded. Bibasilar pneumonia can not be excluded. Electronically Signed   By: Alan Ortega  Register   On: 10/04/2018 09:12    EKG:   Orders placed or performed during the hospital encounter of 10/03/18  . ED EKG within 10 minutes  . ED EKG within 10 minutes  . EKG 12-Lead  . EKG 12-Lead  . EKG    ASSESSMENT AND PLAN:    Acute respiratory failure with hypoxia secondary to COPD exacerbation and left-sided pneumonia and chronic interstitial fibrosis Off  BiPAP IV Solu-Medrol, bronchodilator treatments continued and IV antibiotics  Echocardiogram pending  Sepsis ([place]) -bacteremia Chest x-ray reveals lower lobe pneumonia on the left side IV antibiotics initiated procalcitonin is 0.28 and lactic acid at 1.9 Follow-up on the cultures-growing gram-positive cocci in clusters    COPD with acute exacerbation (HCC) -IV antibiotics as above, IV Solu-Medrol, patient is currently on BiPAP, continue home meds  Hematuria  Check PSA Monitor hemoglobin hematocrit closely Urology consult if needed hydrate with IV fluids       HTN (hypertension) -continue home dose antihypertensives     All the records are reviewed and case discussed with Care Management/Social Workerr. Management plans discussed with the patient, RN and Dr. Mortimer Fries and they are in agreement.  CODE STATUS: fc  TOTAL TIME TAKING CARE OF THIS PATIENT: 35 minutes.    POSSIBLE D/C IN 4  DAYS, DEPENDING ON CLINICAL CONDITION.  Note: This dictation was prepared with Dragon dictation along with smaller phrase technology. Any transcriptional errors that result from this process are unintentional.   Alan Mango M.D on 10/05/2018 at 3:55 PM  Between 7am to 6pm - Pager - [phone] After 6pm go to [email or web address] - password [email or web address] [facility]  Office  [phone]  CC: Primary care physician; [name], MD

## 2018-10-05 NOTE — Progress Notes (Signed)
Patient asking for something to help him relax, he has been tearful throughout the day.  It is his birthday and he lost his wife a year ago yesterday.  RN made Dr. Mortimer Fries aware and MD gave order for 0.5 mg PRN xanax q8H.

## 2018-10-05 NOTE — Progress Notes (Signed)
*  PRELIMINARY RESULTS* Echocardiogram 2D Echocardiogram has been performed.  Alan Ortega 10/05/2018, 3:07 PM

## 2018-10-05 NOTE — Consult Note (Signed)
PULMONARY / CRITICAL CARE MEDICINE  Name: Alan Ortega MRN: [id number] DOB: 26-Jul-1934    LOS: 2  Referring Provider: Dr Jannifer Franklin  Reason for Referral: Sepsis and pneumonia Brief patient description: 85 year old male admitted with pneumonia and sepsis secondary to pneumonia.  CC  follow resp failure  HPI Remains SOB Creat improved +wheezing ECHO shows EF 30% +blood cultures 1/2 gram +cocci-Rocephin started Minimal oxygen 2L [place] Alert and awake Results dicussed with patient and family    VITAL SIGNS: BP (!) 122/96   Pulse 99   Temp (!) 97.4 F (36.3 C) (Oral)   Resp 19   Ht 5\' 7"  (1.702 m)   Wt 64.7 kg   SpO2 95%   BMI 22.34 kg/m   HEMODYNAMICS:    VENTILATOR SETTINGS:    INTAKE / OUTPUT: I/O last 3 completed shifts: In: 2659.7 [IV Piggyback:2659.7] Out: 0    Review of Systems:  Gen:  Denies  fever, sweats, chills weigh loss +mild resp distress Off biPAP HEENT: Denies blurred vision, double vision, ear pain, eye pain, hearing loss, nose bleeds, sore throat Cardiac:  No dizziness, chest pain or heaviness, chest tightness,edema, No JVD Resp:   No cough, -sputum production, +shortness of breath,+wheezing, -hemoptysis,  Gi: Denies swallowing difficulty, stomach pain, nausea or vomiting, diarrhea, constipation, bowel incontinence Gu:  Denies bladder incontinence, burning urine Ext:   Denies Joint pain, stiffness or swelling Skin: Denies  skin rash, easy bruising or bleeding or hives Endoc:  Denies polyuria, polydipsia , polyphagia or weight change Psych:   Denies depression, insomnia or hallucinations  Other:  All other systems negative  Physical Examination:   GENERAL:NAD, no fevers, chills, no weakness no fatigue HEAD: Normocephalic, atraumatic.  EYES: Pupils equal, round, reactive to light. Extraocular muscles intact. No scleral icterus.  MOUTH: Moist mucosal membrane. Dentition intact. No abscess noted.  EAR, NOSE, THROAT: Clear without exudates.  No external lesions.  NECK: Supple. No thyromegaly. No nodules. No JVD.  PULMONARY: CTA B/L +wheezing, CARDIOVASCULAR: S1 and S2. Regular rate and rhythm. No murmurs, rubs, or gallops. No edema. Pedal pulses 2+ bilaterally.  GASTROINTESTINAL: Soft, nontender, nondistended. No masses. Positive bowel sounds. No hepatosplenomegaly.  MUSCULOSKELETAL: No swelling, clubbing, or edema. Range of motion full in all extremities.  NEUROLOGIC: Cranial nerves II through XII are intact. No gross focal neurological deficits. Sensation intact. Reflexes intact.  SKIN: No ulceration, lesions, rashes, or cyanosis. Skin warm and dry. Turgor intact.  PSYCHIATRIC: Mood, affect within normal limits. The patient is awake, alert and oriented x 3. Insight, judgment intact.  ALL OTHER ROS ARE NEGATIVE      BMET Recent Labs  Lab 10/03/18 2115 10/04/18 0053 10/04/18 1707 10/05/18 0416  NA 142 140  --  141  K 3.0* 3.0* 3.9 3.7  CL 107 105  --  112*  CO2 23 21*  --  21*  BUN 21 22  --  27*  CREATININE 1.28* 1.43*  --  1.15  GLUCOSE 139* 218*  --  124*    Electrolytes Recent Labs  Lab 10/03/18 2115 10/04/18 0053 10/05/18 0416  CALCIUM 8.4* 8.3* 8.0*  MG  --  1.8 2.3  PHOS  --  2.8 3.4    CBC Recent Labs  Lab 10/03/18 1950 10/04/18 0053  WBC 16.3* 19.2*  HGB 14.5 14.3  HCT 45.5 45.5  PLT 209 208    Coag's No results for input(s): APTT, INR in the last 168 hours.  Sepsis Markers Recent Labs  Lab 10/04/18  3779 10/04/18 0530 10/04/18 0727 10/04/18 2213 10/05/18 0416  LATICACIDVEN 5.0* 2.6* 2.6* 1.9  --   PROCALCITON 0.13  --   --   --  0.28    ABG No results for input(s): PHART, PCO2ART, PO2ART in the last 168 hours.  Liver Enzymes Recent Labs  Lab 10/03/18 2115  AST 18  ALT 9  ALKPHOS 69  BILITOT 1.5*  ALBUMIN 3.8    Cardiac Enzymes Recent Labs  Lab 10/04/18 0530 10/04/18 1242 10/04/18 2213  TROPONINI 0.84* 1.11* 0.82*    Glucose Recent Labs  Lab  10/04/18 0049  GLUCAP 202*    Imaging No results found.  CULTURES: Blood cultures x2 1/2 +for gram +cocci  ANTIBIOTICS: 1/14-->Rocephin   SIGNIFICANT EVENTS: 10/03/2018: Admitted  LINES/TUBES: Peripheral IVs  DISCUSSION:  85 yo WM admitted to ICU/SD for acute resp failure from acute COPD exacerbation with acute systolic CHF exacerbation with gram pos bacteremia possible pneumonia  Severe Hypoxic and Hypercapnic Respiratory Failure-slowly -continue Bronchodilator Therapy -Wean Fio2    SEVERE COPD EXACERBATION-slow to improve -continue IV steroids as prescribed -continue NEB THERAPY as prescribed -morphine as needed -wean fio2 as needed and tolerated  CARDIAC FAILURE-EF 30% -oxygen as needed -follow up cardiac enzymes as indicated Cardiology Consulted   INFECTIOUS DISEASE -continue antibiotics as prescribed -follow up cultures   Family updated and notified  Remains SD status    Patient/Family are satisfied with Plan of action and management. All questions answered  Corrin Parker, M.D.  [facility]  Medical Director [place] Director [facility] Cardio-Pulmonary Department

## 2018-10-06 DIAGNOSIS — Z8546 Personal history of malignant neoplasm of prostate: Secondary | ICD-10-CM

## 2018-10-06 DIAGNOSIS — N200 Calculus of kidney: Secondary | ICD-10-CM

## 2018-10-06 DIAGNOSIS — R310 Gross hematuria: Secondary | ICD-10-CM

## 2018-10-06 DIAGNOSIS — I5021 Acute systolic (congestive) heart failure: Secondary | ICD-10-CM

## 2018-10-06 LAB — PROTIME-INR
INR: 1.09
Prothrombin Time: 14 seconds (ref 11.4–15.2)

## 2018-10-06 LAB — CBC
HCT: 40.2 % (ref 39.0–52.0)
Hemoglobin: 12.6 g/dL — ABNORMAL LOW (ref 13.0–17.0)
MCH: 31.7 pg (ref 26.0–34.0)
MCHC: 31.3 g/dL (ref 30.0–36.0)
MCV: 101 fL — ABNORMAL HIGH (ref 80.0–100.0)
Platelets: 179 10*3/uL (ref 150–400)
RBC: 3.98 MIL/uL — ABNORMAL LOW (ref 4.22–5.81)
RDW: 14 % (ref 11.5–15.5)
WBC: 17.2 10*3/uL — AB (ref 4.0–10.5)
nRBC: 0 % (ref 0.0–0.2)

## 2018-10-06 LAB — PROCALCITONIN: Procalcitonin: 0.15 ng/mL

## 2018-10-06 LAB — BASIC METABOLIC PANEL
Anion gap: 5 (ref 5–15)
BUN: 35 mg/dL — AB (ref 8–23)
CO2: 25 mmol/L (ref 22–32)
Calcium: 8 mg/dL — ABNORMAL LOW (ref 8.9–10.3)
Chloride: 110 mmol/L (ref 98–111)
Creatinine, Ser: 1.16 mg/dL (ref 0.61–1.24)
GFR calc Af Amer: >60 mL/min (ref >60)
GFR calc non Af Amer: 58 mL/min — ABNORMAL LOW (ref >60)
Glucose, Bld: 115 mg/dL — ABNORMAL HIGH (ref 70–99)
POTASSIUM: 4.6 mmol/L (ref 3.5–5.1)
Sodium: 140 mmol/L (ref 135–145)

## 2018-10-06 LAB — APTT: aPTT: 28 seconds (ref 24–36)

## 2018-10-06 LAB — PSA: Prostatic Specific Antigen: <0.01 ng/mL (ref 0.00–4.00)

## 2018-10-06 MED ORDER — METHYLPREDNISOLONE SODIUM SUCC 40 MG IJ SOLR
20.0000 mg | Freq: Two times a day (BID) | INTRAMUSCULAR | Status: DC
  Administered 2018-10-06 – 2018-10-10 (×8): 20 mg via INTRAVENOUS
  Filled 2018-10-06 (×8): qty 1

## 2018-10-06 MED ORDER — FAMOTIDINE 20 MG PO TABS
20.0000 mg | ORAL_TABLET | Freq: Every day | ORAL | Status: DC
  Administered 2018-10-06 – 2018-10-10 (×5): 20 mg via ORAL
  Filled 2018-10-06 (×5): qty 1

## 2018-10-06 NOTE — Care Management Note (Signed)
Case Management Note  Patient Details  Name: Alan Ortega MRN: [id number] Date of Birth: [birth year]  Subjective/Objective:   Patient admitted for Sepsis and found to have viral bronchitis and exacerbation of CHF.  Patient is from home and lives alone.  Daughter, Arrie Aran, is at the bedside and patient is currently sleeping.  Daughter expresses that she would prefer if her father went to rehab at discharge and he may need long term care.  Currently the patient pays out of pocket for caregivers to come out to home everyday for about 16 hr/ day.  Caregivers are not from any company but were arranged by daughter.  There is someone that comes out and spends the night and then someone also comes during the day for several hours.  Daughter reports that the patient is only alone for about 6 hours out of the day.  Caregivers provide ADL assistance, cooking and light cleaning.  Daughter reports that the only reason the patient leaves the home is to go to a doctor's appointment.  Patient walks with a walker short distances in the home like from the bedroom to the bathroom.   Patient is on chronic O2 2.5L with [employer].  Patient is currently stepdown status and tolerating [place].  RNCM will cont to follow patient progression and assist with any discharge needs.  Doran Clay RN BSN [phone]               Action/Plan:   Expected Discharge Date:                  Expected Discharge Plan:  Skilled Nursing Facility  In-House Referral:  Clinical Social Work  Discharge planning Services  CM Consult  Post Acute Care Choice:    Choice offered to:     DME Arranged:    DME Agency:     HH Arranged:    [place] Agency:     Status of Service:  In process, will continue to follow  If discussed at Long Length of Stay Meetings, dates discussed:    Additional Comments:  Shelbie Hutching, RN 10/06/2018, 2:02 PM

## 2018-10-06 NOTE — Progress Notes (Signed)
[facility] NAME: Alan Ortega    MR#:  [id number]  DATE OF BIRTH:  30-Dec-1933  SUBJECTIVE:  CHIEF COMPLAINT: Patient states that her breathing is improved currently off BiPAP  REVIEW OF SYSTEMS:   CONSTITUTIONAL: No documented fever. No fatigue, weakness. No weight gain, no weight loss.  EYES: No blurry or double vision.  ENT: No tinnitus. No postnasal drip. No redness of the oropharynx.  RESPIRATORY: No cough, no wheeze, no hemoptysis. + dyspnea.  CARDIOVASCULAR: No chest pain. No orthopnea. No palpitations. No syncope.  GASTROINTESTINAL: No nausea, no vomiting or diarrhea. No abdominal pain. No melena or hematochezia.  GENITOURINARY:  No urgency. No frequency. No dysuria. No hematuria. No obstructive symptoms. No discharge. No pain. No significant abnormal bleeding ENDOCRINE: No polyuria or nocturia. No heat or cold intolerance.  HEMATOLOGY: No anemia. No bruising. No bleeding. No purpura. No petechiae INTEGUMENTARY: No rashes. No lesions.  MUSCULOSKELETAL: No arthritis. No swelling. No gout.  NEUROLOGIC: No numbness, tingling, or ataxia. No seizure-type activity.  PSYCHIATRIC: No anxiety. No insomnia. No ADD.     DRUG ALLERGIES:  No Known Allergies  VITALS:  Blood pressure 95/77, pulse 75, temperature (!) 97 F (36.1 C), temperature source Axillary, resp. rate (!) 21, height 5\' 7"  (1.702 m), weight 64.7 kg, SpO2 95 %.  PHYSICAL EXAMINATION:  GENERAL:  85 y.o.-year-old patient lying in the bed with no acute distress.  EYES: Pupils equal, round, reactive to light and accommodation. No scleral icterus. Extraocular muscles intact.  HEENT: Head atraumatic, normocephalic. Oropharynx and nasopharynx clear.  NECK:  Supple, no jugular venous distention. LUNGS: Diminished breath sounds bilaterally, no wheezing, positive rales,rhonchi or crepitation.  No use of accessory muscles of respiration.  CARDIOVASCULAR: S1, S2  normal. No murmurs, rubs, or gallops.  ABDOMEN: Soft, nontender, nondistended. Bowel sounds present.  EXTREMITIES: No pedal edema, cyanosis, or clubbing.  NEUROLOGIC: Awake, alert and oriented x3 sensation intact. Gait not checked.  PSYCHIATRIC: The patient is alert and oriented x 3.  SKIN: No obvious rash, lesion, or ulcer.    LABORATORY PANEL:   CBC Recent Labs  Lab 10/06/18 0543  WBC 17.2*  HGB 12.6*  HCT 40.2  PLT 179   ------------------------------------------------------------------------------------------------------------------  Chemistries  Recent Labs  Lab 10/03/18 2115  10/05/18 0416 10/06/18 0543  NA 142   < > 141 140  K 3.0*   < > 3.7 4.6  CL 107   < > 112* 110  CO2 23   < > 21* 25  GLUCOSE 139*   < > 124* 115*  BUN 21   < > 27* 35*  CREATININE 1.28*   < > 1.15 1.16  CALCIUM 8.4*   < > 8.0* 8.0*  MG  --    < > 2.3  --   AST 18  --   --   --   ALT 9  --   --   --   ALKPHOS 69  --   --   --   BILITOT 1.5*  --   --   --    < > = values in this interval not displayed.   ------------------------------------------------------------------------------------------------------------------  Cardiac Enzymes Recent Labs  Lab 10/04/18 2213  TROPONINI 0.82*   ------------------------------------------------------------------------------------------------------------------  RADIOLOGY:  No results found.  EKG:   Orders placed or performed during the hospital encounter of 10/03/18  . ED EKG within 10 minutes  . ED EKG within 10 minutes  .  EKG 12-Lead  . EKG 12-Lead  . EKG    ASSESSMENT AND PLAN:    1. Acute respiratory failure with hypoxia secondary to COPD exacerbation and left-sided pneumonia and chronic interstitial fibrosis Off  BiPAP IV Solu-Medrol, bronchodilator treatments continued and IV antibiotics  Echocardiogram systolic dysfunction  2.  Possible pneumonia continue antibiotics  3.  COPD with acute exacerbation (HCC) -IV antibiotics as  above, IV Solu-Medrol,    4.  Acute systolic CHF which is a new diagnosis for patient diuresis as tolerated  5. Hematuria  Has improved  6.  HTN (hypertension) -continue home dose antihypertensives     All the records are reviewed and case discussed with Care Management/Social Workerr. Management plans discussed with the patient, RN and Dr. Mortimer Fries and they are in agreement.  CODE STATUS: fc  TOTAL TIME TAKING CARE OF THIS PATIENT: 35 minutes.   POSSIBLE D/C IN 4  DAYS, DEPENDING ON CLINICAL CONDITION.  Note: This dictation was prepared with Dragon dictation along with smaller phrase technology. Any transcriptional errors that result from this process are unintentional.   Dustin Flock M.D on 10/06/2018 at 5:07 PM  Between 7am to 6pm - Pager - [phone] After 6pm go to [email or web address] - password [email or web address] [facility]  Office  [phone]  CC: Primary care physician; [name], MD

## 2018-10-06 NOTE — Progress Notes (Signed)
Notified Hinton Dyer, NP and Dr. Mortimer Fries that patient just had hematuria with bright red blood moderate amount.  Both NP and MD acknowledged and gave no new orders at this time.

## 2018-10-06 NOTE — Progress Notes (Signed)
Pharmacy Electrolyte Monitoring Consult:  Pharmacy consulted to assist in monitoring and replacing electrolytes in this 85 y.o. male admitted on 10/03/2018 with Shortness of Breath   Labs:  Sodium (mmol/L)  Date Value  10/06/2018 140  03/23/2012 141   Potassium (mmol/L)  Date Value  10/06/2018 4.6  03/23/2012 3.7   Magnesium (mg/dL)  Date Value  10/05/2018 2.3  03/05/2012 1.7 (L)   Phosphorus (mg/dL)  Date Value  10/05/2018 3.4   Calcium (mg/dL)  Date Value  10/06/2018 8.0 (L)   Calcium, Total (mg/dL)  Date Value  03/23/2012 8.6   Albumin (g/dL)  Date Value  10/03/2018 3.8  03/23/2012 3.6    Assessment/Plan: No replacement warranted at this time.   Will recheck all electrolytes in 48 hours (1/17 AM).   Pharmacy will continue to monitor and adjust per consult.   Pernell Dupre, PharmD, BCPS Clinical Pharmacist 10/06/2018 7:40 AM

## 2018-10-06 NOTE — Progress Notes (Addendum)
CRITICAL CARE NOTE  CC  Follow up respiratory failure  SUBJECTIVE On [place] 3L O2, denies SOB, chest pain Able to finish breakfast without difficulty Alert, awake, oriented. Normal speech w/o SOB WBC trends down   SIGNIFICANT EVENTS 1/12 - Admission 1/14 - Possible bactermia --> Rocephin 1/15 - 1/2 blood cultures c/w contaminant, +viral bronchitis, discontinued Rocephin   BP 98/87   Pulse 71   Temp (!) 97 F (36.1 C) (Axillary)   Resp 19   Ht 5\' 7"  (1.702 m)   Wt 64.7 kg   SpO2 95%   BMI 22.34 kg/m    REVIEW OF SYSTEMS Review of Systems  Constitutional: Negative for chills and fever.  Respiratory: Negative for cough, sputum production and shortness of breath.   Cardiovascular: Negative for chest pain.  Gastrointestinal: Negative for abdominal pain and nausea.  Neurological: Negative for dizziness and headaches.  All other systems reviewed and are negative.    PHYSICAL EXAMINATION:  GENERAL:No acute distress, -resp distress HEAD: Normocephalic, atraumatic.  EYES: Pupils equal, round, reactive to light.  No scleral icterus.  MOUTH: Moist mucosal membrane. NECK: Supple. No thyromegaly. No nodules. No JVD.  PULMONARY: + expiratory wheezing CARDIOVASCULAR: S1 and S2. Regular rate and rhythm. No murmurs, rubs, or gallops.  GASTROINTESTINAL: Soft, nontender, -distended. No masses. Positive bowel sounds. No hepatosplenomegaly.  MUSCULOSKELETAL: No swelling, clubbing, or edema.  NEUROLOGIC: Alert and awake, normal speech, no neurological deficit SKIN:intact,warm,dry    ASSESSMENT AND PLAN SYNOPSIS  85 y.o. male admitted to ICU/SD for acute resp failure from acute COPD exacerbation with acute systolic CHF and viral bronchitis  Severe Hypoxic and Hypercapnic Respiratory Failure - improving -Continue [place] 3L O2 -Continue on current respiratory tx  Chronic systolic CHF -EF 15-17% -Oxygen as needed -Lasix as tolerated -Follow cardiology recommendation  CARDIAC ICU  monitoring  Viral bronchitis -No need for ABX   GI/Nutrition GI PROPHYLAXIS as indicated DIET-->advance as tolerated Constipation protocol as indicated  ENDO - ICU hypoglycemic\Hyperglycemia protocol -check FSBS per protocol   ELECTROLYTES -follow labs as needed -replace as needed -pharmacy consultation and following   DVT/GI PRX ordered TRANSFUSIONS AS NEEDED MONITOR FSBS ASSESS the need for LABS as needed    Jo-ku Mervyn Skeeters, PA-Student

## 2018-10-06 NOTE — Progress Notes (Signed)
[facility] Cardiology  SUBJECTIVE: The patient reports feeling somewhat better today. He denies chest pain. His shortness of breath has improved.   Vitals:   10/06/18 0821 10/06/18 0849 10/06/18 1127 10/06/18 1140  BP:   98/87   Pulse:   71   Resp:   19   Temp:  (!) 97 F (36.1 C)    TempSrc:  Axillary    SpO2: 95%  94% 95%  Weight:      Height:         Intake/Output Summary (Last 24 hours) at 10/06/2018 1216 Last data filed at 10/06/2018 1055 Gross per 24 hour  Intake 700 ml  Output 475 ml  Net 225 ml      PHYSICAL EXAM  General: Elderly, frail gentleman lying in bed watching TV in no acute distress HEENT:  Normocephalic and atramatic Neck:  No JVD.  Lungs: Diminished breath sounds throughout. Normal effort of breathing on supplemental oxygen via nasal cannula Heart: HRRR . Normal S1 and S2 without gallops or murmurs.  Abdomen: nondistended Msk:  Back normal, gait not assessed. Muscle atrophy throughout. Extremities: No clubbing, cyanosis or edema.   Neuro: Alert and oriented X 3. Psych:  Good affect, responds appropriately   LABS: Basic Metabolic Panel: Recent Labs    10/04/18 0053  10/05/18 0416 10/06/18 0543  NA 140  --  141 140  K 3.0*   < > 3.7 4.6  CL 105  --  112* 110  CO2 21*  --  21* 25  GLUCOSE 218*  --  124* 115*  BUN 22  --  27* 35*  CREATININE 1.43*  --  1.15 1.16  CALCIUM 8.3*  --  8.0* 8.0*  MG 1.8  --  2.3  --   PHOS 2.8  --  3.4  --    < > = values in this interval not displayed.   Liver Function Tests: Recent Labs    10/03/18 2115  AST 18  ALT 9  ALKPHOS 69  BILITOT 1.5*  PROT 7.3  ALBUMIN 3.8   No results for input(s): LIPASE, AMYLASE in the last 72 hours. CBC: Recent Labs    10/03/18 1950 10/04/18 0053 10/06/18 0543  WBC 16.3* 19.2* 17.2*  NEUTROABS 10.9*  --   --   HGB 14.5 14.3 12.6*  HCT 45.5 45.5 40.2  MCV 99.1 99.3 101.0*  PLT 209 208 179   Cardiac Enzymes: Recent Labs    10/04/18 0530 10/04/18 1242  10/04/18 2213  TROPONINI 0.84* 1.11* 0.82*   BNP: Invalid input(s): POCBNP D-Dimer: No results for input(s): DDIMER in the last 72 hours. Hemoglobin A1C: No results for input(s): HGBA1C in the last 72 hours. Fasting Lipid Panel: No results for input(s): CHOL, HDL, LDLCALC, TRIG, CHOLHDL, LDLDIRECT in the last 72 hours. Thyroid Function Tests: No results for input(s): TSH, T4TOTAL, T3FREE, THYROIDAB in the last 72 hours.  Invalid input(s): FREET3 Anemia Panel: No results for input(s): VITAMINB12, FOLATE, FERRITIN, TIBC, IRON, RETICCTPCT in the last 72 hours.  No results found.   Echo LVEF 30-35%  TELEMETRY: Sinus rhythm  ASSESSMENT AND PLAN:  Principal Problem:   Sepsis ([place]) Active Problems:   CAP (community acquired pneumonia)   HTN (hypertension)   HLD (hyperlipidemia)   COPD with acute exacerbation ([place])   CAD (coronary artery disease)   Acute respiratory failure with hypoxia ([place])    1. Elevated troponin without peak or trough in the setting of acute respiratory failure with hypoxia secondary to  bronchitis/COPD exacerbation and acute on chronic systolic CHF, currently without chest pain. Troponin 0.04, 0.20, 0.84, 1.11, followed by 0.82.  ECG revealed sinus tachycardia at a rate of 103 bpm with occasional PVCs with right bundle branch block without evidence of ischemia. Consistent with demand supply ischemia. 2. Acute on chronic systolic congestive heart failure, chest xray with small bilateral pleural effusions. Recent echocardiogram reveals left ventricular function of 30-35% as compared to previous echocardiogram in 2014 revealing LVEF 45%. Started on carvedilol and lisinopril. 3. Known coronary artery disease, status post CABG x 3 in 2002, currently without chest pain  Recommendations: 1. Defer cardiac catheterization in the absence of chest pain 2. Continue carvedilol, lisinopril, simvastatin, and Lasix, with careful monitoring of renal status 3. No further  cardiac diagnostics recommended at this time   Clabe Seal, Hershal Coria 10/06/2018 12:16 PM  The patient was also evaluated by Dr. Saralyn Pilar and the plan was made in collaboration with him.

## 2018-10-06 NOTE — Consult Note (Signed)
PULMONARY / CRITICAL CARE MEDICINE  Name: Alan Ortega MRN: [id number] DOB: 26-Apr-1934    LOS: 3  Referring Provider: Dr Jannifer Franklin  Reason for Referral: CHF   CC  Follow up resp failure  HPI Mild SOB Creat improved No wheezing this AM ECHO EF 30% 1/2 coag neg staph-contaminant Minimal oxygen Alert and awake No resp distress   VITAL SIGNS: BP 94/66   Pulse 71   Temp 98.1 F (36.7 C) (Axillary)   Resp 15   Ht 5\' 7"  (1.702 m)   Wt 64.7 kg   SpO2 98%   BMI 22.34 kg/m   HEMODYNAMICS:    VENTILATOR SETTINGS: FiO2 (%):  [30 %] 30 %  INTAKE / OUTPUT: I/O last 3 completed shifts: In: 25 [P.O.:360; IV Piggyback:100] Out: -     Review of Systems:  Gen:  Denies  fever, sweats, chills weigh loss  HEENT: Denies blurred vision, double vision, ear pain, eye pain, hearing loss, nose bleeds, sore throat Cardiac:  No dizziness, chest pain or heaviness, chest tightness,edema, No JVD Resp:+cough, -sputum production, +shortness of breath,-wheezing, -hemoptysis,  Gi: Denies swallowing difficulty, stomach pain, nausea or vomiting, diarrhea, constipation, bowel incontinence Gu:  Denies bladder incontinence, burning urine Ext:   Denies Joint pain, stiffness or swelling Skin: Denies  skin rash, easy bruising or bleeding or hives Endoc:  Denies polyuria, polydipsia , polyphagia or weight change Psych:   Denies depression, insomnia or hallucinations  Other:  All other systems negative   Physical Examination:   GENERAL:NAD, no fevers, chills, no weakness no fatigue HEAD: Normocephalic, atraumatic.  EYES: Pupils equal, round, reactive to light. Extraocular muscles intact. No scleral icterus.  MOUTH: Moist mucosal membrane. Dentition intact. No abscess noted.  EAR, NOSE, THROAT: Clear without exudates. No external lesions.  NECK: Supple. No thyromegaly. No nodules. No JVD.  PULMONARY: CTA B/L no wheezing, rhonchi, crackles CARDIOVASCULAR: S1 and S2. Regular rate and  rhythm. No murmurs, rubs, or gallops. No edema. Pedal pulses 2+ bilaterally.  GASTROINTESTINAL: Soft, nontender, nondistended. No masses. Positive bowel sounds. No hepatosplenomegaly.  MUSCULOSKELETAL: No swelling, clubbing, or edema. Range of motion full in all extremities.  NEUROLOGIC: Cranial nerves II through XII are intact. No gross focal neurological deficits. Sensation intact. Reflexes intact.  SKIN: No ulceration, lesions, rashes, or cyanosis. Skin warm and dry. Turgor intact.  PSYCHIATRIC: Mood, affect within normal limits. The patient is awake, alert and oriented x 3. Poor Insight,  ALL OTHER ROS ARE NEGATIVE          BMET Recent Labs  Lab 10/04/18 0053 10/04/18 1707 10/05/18 0416 10/06/18 0543  NA 140  --  141 140  K 3.0* 3.9 3.7 4.6  CL 105  --  112* 110  CO2 21*  --  21* 25  BUN 22  --  27* 35*  CREATININE 1.43*  --  1.15 1.16  GLUCOSE 218*  --  124* 115*    Electrolytes Recent Labs  Lab 10/04/18 0053 10/05/18 0416 10/06/18 0543  CALCIUM 8.3* 8.0* 8.0*  MG 1.8 2.3  --   PHOS 2.8 3.4  --     CBC Recent Labs  Lab 10/03/18 1950 10/04/18 0053 10/06/18 0543  WBC 16.3* 19.2* 17.2*  HGB 14.5 14.3 12.6*  HCT 45.5 45.5 40.2  PLT 209 208 179    Coag's No results for input(s): APTT, INR in the last 168 hours.  Sepsis Markers Recent Labs  Lab 10/04/18 0053 10/04/18 0530 10/04/18 0727 10/04/18 2213 10/05/18 0416  10/06/18 0543  LATICACIDVEN 5.0* 2.6* 2.6* 1.9  --   --   PROCALCITON 0.13  --   --   --  0.28 0.15    ABG No results for input(s): PHART, PCO2ART, PO2ART in the last 168 hours.  Liver Enzymes Recent Labs  Lab 10/03/18 2115  AST 18  ALT 9  ALKPHOS 69  BILITOT 1.5*  ALBUMIN 3.8    Cardiac Enzymes Recent Labs  Lab 10/04/18 0530 10/04/18 1242 10/04/18 2213  TROPONINI 0.84* 1.11* 0.82*    Glucose Recent Labs  Lab 10/04/18 0049  GLUCAP 202*    Imaging No results found.  CULTURES: Blood cultures x2 1/2 +for gram  +cocci  ANTIBIOTICS: 1/14-->Rocephin   SIGNIFICANT EVENTS: 10/03/2018: Admitted  LINES/TUBES: Peripheral IVs  DISCUSSION:  85 yo WM admitted to ICU/SD for acute resp failure from acute COPD exacerbation with acute systolic CHF exacerbation  Blood cultures c/w contaminant, +viral bronchitis   Severe Hypoxic and Hypercapnic Respiratory Failure-resolving  CARDIAC FAILURE-EF 29% New onset systolic CHF -oxygen as needed -Lasix as tolerated -follow up cardiac enzymes as indicated Cardiology consulted   INFECTIOUS DISEASE 1/2 blood cultures Coag NEG staph Is contaminant No need for ABX  +VIRAL BRONCHITIS No need for ABX at this time   Consider transfer to gen med floor   Jmari Pelc Patricia Pesa, M.D.  [facility]  Medical Director [place] Director [place] Department

## 2018-10-06 NOTE — Consult Note (Signed)
Urology Consult  I have been asked to see the patient by Dr. Mortimer Fries, for evaluation and management of gross hematuria.  Chief Complaint: blood in urine  History of Present Illness: Alan Ortega is a 85 y.o. year old male admitted to the ICU with sepsis/bacteremia presumably from pneumonia, COPD and CHF exacerbation.  Urology was consulted due to hematuria.  Patient had a Foley catheter placed 2 days ago.  This was presumably traumatic and he demanded that the catheter be removed immediately following placement.  There was bright red blood upon removal.  He has continued to have bloody urine without clots.    He has refused the have the catheter replaced.    He has severe stress incontinence, wears diaper.  Bladder scan this evening ~150 cc in bladder.  He denies any issues voiding without dysuria or discomfort.  He feels like he is emptying.    He has a personal history of prostate cancer with remote history of prostatectomy (Dr. [name] Dr. [name]) followed by radiation ? [facility] vs. Adjvant.  Most recent PSA <0.01 today.   Cr has trended down during this admission.  Hgb slightly downward trending.    Not on anticoagulants.    He has not seen a Dealer in many years.  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   Past Medical History:  Diagnosis Date  . CAD (coronary artery disease)   . Cancer [facility])    Prostate  . COPD (chronic obstructive pulmonary disease) ([place])   . HLD (hyperlipidemia)   . Hypertension     Past Surgical History:  Procedure Laterality Date  . CHOLECYSTECTOMY    . COLONOSCOPY    . CORONARY ANGIOPLASTY    . CORONARY ARTERY BYPASS GRAFT    . EXCISION MASS LOWER EXTREMETIES Right 09/09/2018   Procedure: EXCISION MASS LOWER EXTREMETIES;  Surgeon: Benjamine Sprague, DO;  Location: [facility] ORS;  Service: General;  Laterality: Right;  . TONSILLECTOMY      Home Medications:  Current Meds    Medication Sig  . aspirin EC 81 MG tablet Take 81 mg by mouth at bedtime.   Marland Kitchen escitalopram (LEXAPRO) 10 MG tablet Take 10 mg by mouth at bedtime.   . simvastatin (ZOCOR) 20 MG tablet Take 20 mg by mouth at bedtime.     Allergies: No Known Allergies  Family History  Problem Relation Age of Onset  . Heart disease Mother   . Stroke Father   . Kidney disease Brother     Social History:  reports that he has never smoked. He has never used smokeless tobacco. He reports that he does not drink alcohol or use drugs.  ROS: A complete review of systems was performed.  All systems are negative except for pertinent findings as noted.  Physical Exam:  Vital signs in last 24 hours: Temp:  [97 F (36.1 C)-98.1 F (36.7 C)] 97 F (36.1 C) (01/15 1200) Pulse Rate:  [58-90] 75 (01/15 1400) Resp:  [15-23] 21 (01/15 1600) BP: (86-111)/(57-87) 95/77 (01/15 1600) SpO2:  [89 %-98 %] 95 % (01/15 1600) FiO2 (%):  [30 %] 30 % (01/15 0200) Constitutional:  Alert and oriented, No acute distress.  Daughter at bedside.  Eating dinner. HEENT: [place] AT, moist mucus membranes.  Trachea midline, no masses.  Somewhat hard of hearing. Respiratory: Wearning 02, no respiratory distress. GI: Abdomen is soft, nontender, nondistended, no abdominal masses.  Lower  midline abdominal scar. GU: Uncircumcised phallus.  Normal scrotum. Light pink urine in diaper. Skin: No rashes, bruises or suspicious lesions Neurologic: Grossly intact, no focal deficits, moving all 4 extremities Psychiatric: Normal mood and affect   Laboratory Data:  Recent Labs    10/03/18 1950 10/04/18 0053 10/06/18 0543  WBC 16.3* 19.2* 17.2*  HGB 14.5 14.3 12.6*  HCT 45.5 45.5 40.2   Recent Labs    10/04/18 0053 10/04/18 1707 10/05/18 0416 10/06/18 0543  NA 140  --  141 140  K 3.0* 3.9 3.7 4.6  CL 105  --  112* 110  CO2 21*  --  21* 25  GLUCOSE 218*  --  124* 115*  BUN 22  --  27* 35*  CREATININE 1.43*  --  1.15 1.16  CALCIUM 8.3*   --  8.0* 8.0*    Impression/Plan:  85 yo M admitted to ICU for sepsis/ bacteremia with gross hematuria x 2 days after traumatic cath.  1. Hematuria- does not appear to be severe as patient has been voiding without clots and should hopefully resolve spontaneously.  Bladder scan reassuring.  Prefer to avoid manipulation of bladder neck, see #4 if at all possible.  Discussed recommendation for outpatient cystoscopy in order to r/o any underlying pathology. Patient also prefers to avoid cath at this time if at all possible.  Trend Hgb, will reassess tomorrow.    2. Kidney stones - recommend outpatient follow up given the size and obstructing nature of right staghorn since at least 2016 without surveillance or urologic care  3. History of prostate cancer- NED, PSA undetectable  4. Probable bladder neck contracture- difficult foley likely secondary to this.  Prefer to avoid manipulation as possible.  10/06/2018, 5:22 PM  Hollice Espy,  MD

## 2018-10-06 NOTE — Progress Notes (Signed)
Karlene Lineman, NP aware that patient's blood pressures have been soft during shift and current BP 105/80 with coreg due at this time and ordered BID with the addition of lisinopril daily.  NP gave order to hold this evenings dose of coreg.

## 2018-10-07 LAB — CULTURE, BLOOD (ROUTINE X 2): Special Requests: ADEQUATE

## 2018-10-07 MED ORDER — GUAIFENESIN-DM 100-10 MG/5ML PO SYRP
5.0000 mL | ORAL_SOLUTION | ORAL | Status: DC | PRN
  Administered 2018-10-07: 5 mL via ORAL
  Filled 2018-10-07 (×2): qty 5

## 2018-10-07 NOTE — Evaluation (Signed)
Physical Therapy Evaluation Patient Details Name: Alan Ortega MRN: [id number] DOB: [birth year] Today's Date: 10/07/2018   History of Present Illness  Patient is an 85 year old male admitted with SOB. Patient found to have PNA, sepsis. Patient has PMH to include: HTN, HLD, COPD, CAD, ARF.    Clinical Impression  Patient agreeable to PT evaluation, however declines OOB activity or sitting on the side of the bed. Reports it makes him feel sick to sit up. Patient did agree to perform B LE exercises. Patient will require PT follow up to assess mobility. Patient reports multiple falls and that he was receiving HHPT prior to admission. Patient will benefit from continued skilled PT to address his mobility, falls and weakness.      Follow Up Recommendations Home health PT;Other (comment)(dependent on mobility)    Equipment Recommendations  None recommended by PT    Recommendations for Other Services Speech consult     Precautions / Restrictions Precautions Precautions: Fall Restrictions Weight Bearing Restrictions: No      Mobility  Bed Mobility               General bed mobility comments: patient declined bed mobility  Transfers                    Ambulation/Gait             General Gait Details: not assessed  Stairs            Wheelchair Mobility    Modified Rankin (Stroke Patients Only)       Balance                                             Pertinent Vitals/Pain Pain Assessment: No/denies pain    Home Living Family/patient expects to be discharged to:: Private residence Living Arrangements: Alone Available Help at Discharge: Available PRN/intermittently Type of Home: House Home Access: Stairs to enter   [employer] of Steps: 1 Home Layout: One level Home Equipment: Environmental consultant - 2 wheels;Walker - 4 wheels      Prior Function Level of Independence: Independent with assistive device(s)                Hand Dominance        Extremity/Trunk Assessment   Upper Extremity Assessment Upper Extremity Assessment: Overall WFL for tasks assessed;Generalized weakness    Lower Extremity Assessment Lower Extremity Assessment: Overall WFL for tasks assessed;Generalized weakness       Communication   Communication: HOH  Cognition Arousal/Alertness: Awake/alert Behavior During Therapy: WFL for tasks assessed/performed Overall Cognitive Status: Within Functional Limits for tasks assessed                                        General Comments      Exercises Total Joint Exercises Ankle Circles/Pumps: AROM;10 reps Heel Slides: AROM;10 reps Hip ABduction/ADduction: AROM;10 reps Straight Leg Raises: AROM;10 reps   Assessment/Plan    PT Assessment Patient needs continued PT services  PT Problem List Decreased strength;Decreased mobility;Decreased activity tolerance;Decreased balance;Cardiopulmonary status limiting activity       PT Treatment Interventions Gait training;Therapeutic activities;Therapeutic exercise;Balance training;Functional mobility training;Patient/family education;Neuromuscular re-education    PT Goals (Current goals can be found in the Care Plan section)  Acute Rehab PT Goals Patient Stated Goal: to return home PT Goal Formulation: With patient Time For Goal Achievement: 10/21/18 Potential to Achieve Goals: Fair    Frequency Min 2X/week   Barriers to discharge Decreased caregiver support      Co-evaluation               AM-PAC PT "6 Clicks" Mobility  Outcome Measure Help needed turning from your back to your side while in a flat bed without using bedrails?: A Lot Help needed moving from lying on your back to sitting on the side of a flat bed without using bedrails?: A Lot Help needed moving to and from a bed to a chair (including a wheelchair)?: A Lot Help needed standing up from a chair using your arms (e.g., wheelchair or  bedside chair)?: A Lot Help needed to walk in hospital room?: A Lot Help needed climbing 3-5 steps with a railing? : A Lot 6 Click Score: 12    End of Session Equipment Utilized During Treatment: Oxygen Activity Tolerance: Other (comment)(patient unwilling to sit at edge of bed because he says it makes him feel sick) Patient left: in bed   PT Visit Diagnosis: Muscle weakness (generalized) (M62.81);History of falling (Z91.81)    Time: 5102-5852 PT Time Calculation (min) (ACUTE ONLY): 23 min   Charges:   PT Evaluation $PT Eval Moderate Complexity: 1 Mod PT Treatments $Therapeutic Exercise: 8-22 mins        Anabella Capshaw, PT, GCS 10/07/18,4:19 PM

## 2018-10-07 NOTE — Progress Notes (Signed)
[facility] Cardiology  SUBJECTIVE: Patient laying in bed, denies chest pain   Vitals:   10/07/18 0000 10/07/18 0339 10/07/18 0400 10/07/18 0600  BP: 99/72  95/64 97/75  Pulse: 95  77 65  Resp: 17  20 14   Temp:   (!) 97 F (36.1 C)   TempSrc:   Axillary   SpO2: 99% 100% 97% 97%  Weight:      Height:         Intake/Output Summary (Last 24 hours) at 10/07/2018 0817 Last data filed at 10/06/2018 1623 Gross per 24 hour  Intake 360 ml  Output 675 ml  Net -315 ml      PHYSICAL EXAM  General: Well developed, well nourished, in no acute distress HEENT:  Normocephalic and atramatic Neck:  No JVD.  Lungs: Lateral coarse rhonchi Heart: HRRR . Normal S1 and S2 without gallops or murmurs.  Abdomen: Bowel sounds are positive, abdomen soft and non-tender  Msk:  Back normal, normal gait. Normal strength and tone for age. Extremities: No clubbing, cyanosis or edema.   Neuro: Alert and oriented X 3. Psych:  Good affect, responds appropriately   LABS: Basic Metabolic Panel: Recent Labs    10/05/18 0416 10/06/18 0543  NA 141 140  K 3.7 4.6  CL 112* 110  CO2 21* 25  GLUCOSE 124* 115*  BUN 27* 35*  CREATININE 1.15 1.16  CALCIUM 8.0* 8.0*  MG 2.3  --   PHOS 3.4  --    Liver Function Tests: No results for input(s): AST, ALT, ALKPHOS, BILITOT, PROT, ALBUMIN in the last 72 hours. No results for input(s): LIPASE, AMYLASE in the last 72 hours. CBC: Recent Labs    10/06/18 0543  WBC 17.2*  HGB 12.6*  HCT 40.2  MCV 101.0*  PLT 179   Cardiac Enzymes: Recent Labs    10/04/18 1242 10/04/18 2213  TROPONINI 1.11* 0.82*   BNP: Invalid input(s): POCBNP D-Dimer: No results for input(s): DDIMER in the last 72 hours. Hemoglobin A1C: No results for input(s): HGBA1C in the last 72 hours. Fasting Lipid Panel: No results for input(s): CHOL, HDL, LDLCALC, TRIG, CHOLHDL, LDLDIRECT in the last 72 hours. Thyroid Function Tests: No results for input(s): TSH, T4TOTAL, T3FREE, THYROIDAB in  the last 72 hours.  Invalid input(s): FREET3 Anemia Panel: No results for input(s): VITAMINB12, FOLATE, FERRITIN, TIBC, IRON, RETICCTPCT in the last 72 hours.  No results found.   Echo LVEF 30 to 35%  TELEMETRY: Sinus arrhythmia at 72 bpm:  ASSESSMENT AND PLAN:  Principal Problem:   Sepsis ([place]) Active Problems:   CAP (community acquired pneumonia)   HTN (hypertension)   HLD (hyperlipidemia)   COPD with acute exacerbation (HCC)   CAD (coronary artery disease)   Acute respiratory failure with hypoxia (HCC)   Gross hematuria   History of prostate cancer   Kidney stones    1.  Mildly elevated troponin ( 0.04, 0.20, 0.84, 1.1 ), in the absence of chest pain or new ECG changes, in the setting of respiratory failure with hypoxia, likely demand supply ischemia, not due to acute coronary syndrome 2.  Respiratory failure with hypoxia, likely multifactorial, secondary to COPD exacerbation, bronchitis/ ? pneumonia, and acute on chronic systolic congestive heart failure, slowly improving, now off of BiPAP, on O2 by nasal cannula 3.  Acute on chronic systolic congestive heart failure, with interstitial edema and small bilateral PE effusions on chest x-ray  4.  Known CAD, status post CABG times 11/2000, currently without chest pain  Recommendations  1.  Agree with overall current therapy 2.  Diuresis as needed 3.  Continue carvedilol 3.125 mg twice daily 4.  Continue lisinopril 5.  Defer full dose anticoagulation 6.  For cardiac catheterization at this time 7.  Start spironolactone 12.5 mg to 25 mg daily prior to discharge   Isaias Cowman, MD, PhD, [facility] 10/07/2018 8:17 AM

## 2018-10-07 NOTE — Progress Notes (Signed)
[place] [facility] [place] NAME: Alan Ortega    MR#:  [id number]  DATE OF BIRTH:  17-Jan-1934  SUBJECTIVE:  CHIEF COMPLAINT: Patient denying any complaints currently  REVIEW OF SYSTEMS:   CONSTITUTIONAL: No documented fever. No fatigue, weakness. No weight gain, no weight loss.  EYES: No blurry or double vision.  ENT: No tinnitus. No postnasal drip. No redness of the oropharynx.  RESPIRATORY: No cough, no wheeze, no hemoptysis. + dyspnea.  CARDIOVASCULAR: No chest pain. No orthopnea. No palpitations. No syncope.  GASTROINTESTINAL: No nausea, no vomiting or diarrhea. No abdominal pain. No melena or hematochezia.  GENITOURINARY:  No urgency. No frequency. No dysuria. No hematuria. No obstructive symptoms. No discharge. No pain. No significant abnormal bleeding ENDOCRINE: No polyuria or nocturia. No heat or cold intolerance.  HEMATOLOGY: No anemia. No bruising. No bleeding. No purpura. No petechiae INTEGUMENTARY: No rashes. No lesions.  MUSCULOSKELETAL: No arthritis. No swelling. No gout.  NEUROLOGIC: No numbness, tingling, or ataxia. No seizure-type activity.  PSYCHIATRIC: No anxiety. No insomnia. No ADD.     DRUG ALLERGIES:  No Known Allergies  VITALS:  Blood pressure 100/75, pulse 64, temperature (!) 97.5 F (36.4 C), temperature source Oral, resp. rate 19, height 5\' 7"  (1.702 m), weight 64.7 kg, SpO2 96 %.  PHYSICAL EXAMINATION:  GENERAL:  85 y.o.-year-old patient lying in the bed with no acute distress.  EYES: Pupils equal, round, reactive to light and accommodation. No scleral icterus. Extraocular muscles intact.  HEENT: Head atraumatic, normocephalic. Oropharynx and nasopharynx clear.  NECK:  Supple, no jugular venous distention. LUNGS: Diminished breath sounds bilaterally, no wheezing, positive rales,rhonchi or crepitation.  No use of accessory muscles of respiration.  CARDIOVASCULAR: S1, S2 normal. No murmurs, rubs, or  gallops.  ABDOMEN: Soft, nontender, nondistended. Bowel sounds present.  EXTREMITIES: No pedal edema, cyanosis, or clubbing.  NEUROLOGIC: Awake, alert and oriented x3 sensation intact. Gait not checked.  PSYCHIATRIC: The patient is alert and oriented x 3.  SKIN: No obvious rash, lesion, or ulcer.    LABORATORY PANEL:   CBC Recent Labs  Lab 10/06/18 0543  WBC 17.2*  HGB 12.6*  HCT 40.2  PLT 179   ------------------------------------------------------------------------------------------------------------------  Chemistries  Recent Labs  Lab 10/03/18 2115  10/05/18 0416 10/06/18 0543  NA 142   < > 141 140  K 3.0*   < > 3.7 4.6  CL 107   < > 112* 110  CO2 23   < > 21* 25  GLUCOSE 139*   < > 124* 115*  BUN 21   < > 27* 35*  CREATININE 1.28*   < > 1.15 1.16  CALCIUM 8.4*   < > 8.0* 8.0*  MG  --    < > 2.3  --   AST 18  --   --   --   ALT 9  --   --   --   ALKPHOS 69  --   --   --   BILITOT 1.5*  --   --   --    < > = values in this interval not displayed.   ------------------------------------------------------------------------------------------------------------------  Cardiac Enzymes Recent Labs  Lab 10/04/18 2213  TROPONINI 0.82*   ------------------------------------------------------------------------------------------------------------------  RADIOLOGY:  No results found.  EKG:   Orders placed or performed during the hospital encounter of 10/03/18  . ED EKG within 10 minutes  . ED EKG within 10 minutes  . EKG 12-Lead  . EKG 12-Lead  .  EKG    ASSESSMENT AND PLAN:    1. Acute respiratory failure with hypoxia secondary to COPD exacerbation and left-sided pneumonia and chronic interstitial fibrosis Off  BiPAP Continue IV Solu-Medrol, bronchodilator treatments continued and IV antibiotics  Echocardiogram systolic dysfunction  2.  Possible pneumonia continue antibiotics  3.  COPD with acute exacerbation (HCC) -IV antibiotics as above, IV  Solu-Medrol,    4.  Acute systolic CHF which is a new diagnosis for patient diuresis as tolerated  5. Hematuria  Has improved appreciate urology input  6.  HTN (hypertension) -continue home dose antihypertensives     All the records are reviewed and case discussed with Care Management/Social Workerr. Management plans discussed with the patient, RN and Dr. Mortimer Fries and they are in agreement.  CODE STATUS: fc  TOTAL TIME TAKING CARE OF THIS PATIENT: 35 minutes.   POSSIBLE D/C IN 4  DAYS, DEPENDING ON CLINICAL CONDITION.  Note: This dictation was prepared with Dragon dictation along with smaller phrase technology. Any transcriptional errors that result from this process are unintentional.   Dustin Flock M.D on 10/07/2018 at 3:47 PM  Between 7am to 6pm - Pager - [phone] After 6pm go to [email or web address] - password [email or web address] [facility]  Office  [phone]  CC: Primary care physician; [name], MD

## 2018-10-08 ENCOUNTER — Inpatient Hospital Stay: Payer: Medicare HMO

## 2018-10-08 LAB — CBC
HCT: 39.7 % (ref 39.0–52.0)
Hemoglobin: 12.3 g/dL — ABNORMAL LOW (ref 13.0–17.0)
MCH: 31.6 pg (ref 26.0–34.0)
MCHC: 31 g/dL (ref 30.0–36.0)
MCV: 102.1 fL — ABNORMAL HIGH (ref 80.0–100.0)
Platelets: 171 10*3/uL (ref 150–400)
RBC: 3.89 MIL/uL — ABNORMAL LOW (ref 4.22–5.81)
RDW: 13.7 % (ref 11.5–15.5)
WBC: 14.3 10*3/uL — ABNORMAL HIGH (ref 4.0–10.5)
nRBC: 0 % (ref 0.0–0.2)

## 2018-10-08 LAB — CULTURE, BLOOD (ROUTINE X 2)
Culture: NO GROWTH
SPECIAL REQUESTS: ADEQUATE

## 2018-10-08 LAB — BASIC METABOLIC PANEL
Anion gap: 3 — ABNORMAL LOW (ref 5–15)
BUN: 36 mg/dL — ABNORMAL HIGH (ref 8–23)
CO2: 27 mmol/L (ref 22–32)
Calcium: 7.4 mg/dL — ABNORMAL LOW (ref 8.9–10.3)
Chloride: 110 mmol/L (ref 98–111)
Creatinine, Ser: 1.21 mg/dL (ref 0.61–1.24)
GFR calc Af Amer: >60 mL/min (ref >60)
GFR, EST NON AFRICAN AMERICAN: 55 mL/min — AB (ref >60)
Glucose, Bld: 133 mg/dL — ABNORMAL HIGH (ref 70–99)
Potassium: 4.7 mmol/L (ref 3.5–5.1)
Sodium: 140 mmol/L (ref 135–145)

## 2018-10-08 LAB — MAGNESIUM: Magnesium: 2.3 mg/dL (ref 1.7–2.4)

## 2018-10-08 MED ORDER — POLYETHYLENE GLYCOL 3350 17 G PO PACK
17.0000 g | PACK | Freq: Every day | ORAL | Status: DC
  Administered 2018-10-09: 17 g via ORAL
  Filled 2018-10-08 (×3): qty 1

## 2018-10-08 MED ORDER — DOCUSATE SODIUM 100 MG PO CAPS
100.0000 mg | ORAL_CAPSULE | Freq: Two times a day (BID) | ORAL | Status: DC
  Administered 2018-10-08 – 2018-10-10 (×3): 100 mg via ORAL
  Filled 2018-10-08 (×4): qty 1

## 2018-10-08 NOTE — Progress Notes (Signed)
Pharmacy Electrolyte Monitoring Consult:  Pharmacy consulted to assist in monitoring and replacing electrolytes in this 85 y.o. male admitted on 10/03/2018 with Shortness of Breath   Labs:  Sodium (mmol/L)  Date Value  10/08/2018 140  03/23/2012 141   Potassium (mmol/L)  Date Value  10/08/2018 4.7  03/23/2012 3.7   Magnesium (mg/dL)  Date Value  10/08/2018 2.3  03/05/2012 1.7 (L)   Phosphorus (mg/dL)  Date Value  10/05/2018 3.4   Calcium (mg/dL)  Date Value  10/08/2018 7.4 (L)   Calcium, Total (mg/dL)  Date Value  03/23/2012 8.6   Albumin (g/dL)  Date Value  10/03/2018 3.8  03/23/2012 3.6    Assessment/Plan: Electrolytes WNL x 3 days. No replacement warranted at this time.   Pharmacy will sign off at this time.   Pernell Dupre, PharmD, BCPS Clinical Pharmacist 10/08/2018 7:29 AM

## 2018-10-08 NOTE — Progress Notes (Signed)
Physical Therapy Treatment Patient Details Name: Alan Ortega MRN: [id number] DOB: 08-14-1934 Today's Date: 10/08/2018    History of Present Illness Patient is an 85 year old male admitted with SOB. Patient found to have PNA, sepsis. Patient has PMH to include: HTN, HLD, COPD, CAD, ARF.    PT Comments    Pt presents with deficits in strength, transfers, mobility, gait, and activity tolerance.  Pt required significant encouragement to attempt to get into the chair this session with education provided on physiological benefits of activity but once pt began participating with PT services he became more motivated and overall performed well during the session.  Pt did not require physical assistance to go from sup to sit and was CGA with good control and stability during sit to/from stand.  Pt ambulated 5' with a RW and CGA with good stability with SpO2 and HR WNL. Pt was only minimally SOB during the session mostly after bouts of coughing.  Pt confirmed he will have 24/hr assist at home and will benefit from [place] services upon discharge to address the above deficits.      Follow Up Recommendations  Home health PT     Equipment Recommendations  None recommended by PT    Recommendations for Other Services       Precautions / Restrictions Precautions Precautions: Fall Restrictions Weight Bearing Restrictions: No    Mobility  Bed Mobility Overal bed mobility: Needs Assistance Bed Mobility: Supine to Sit     Supine to sit: Supervision     General bed mobility comments: Extra time and effort required but no physical assistance  Transfers Overall transfer level: Needs assistance Equipment used: Rolling walker (2 wheeled) Transfers: Sit to/from Stand Sit to Stand: From elevated surface;Min guard         General transfer comment: Good eccentric and concentric control with transfers  Ambulation/Gait Ambulation/Gait assistance: Min guard Gait Distance (Feet): 5  Feet Assistive device: Rolling walker (2 wheeled) Gait Pattern/deviations: Step-through pattern;Decreased step length - right;Decreased step length - left Gait velocity: decreased   General Gait Details: Pt steady during amb including during 90 turn without LOB and with SpO2 and HR WNL   Stairs             Wheelchair Mobility    Modified Rankin (Stroke Patients Only)       Balance Overall balance assessment: No apparent balance deficits (not formally assessed)                                          Cognition Arousal/Alertness: Awake/alert Behavior During Therapy: WFL for tasks assessed/performed Overall Cognitive Status: Within Functional Limits for tasks assessed                                        Exercises Total Joint Exercises Ankle Circles/Pumps: Strengthening;Both;10 reps;15 reps Quad Sets: Strengthening;Both;5 reps;10 reps Gluteal Sets: Strengthening;Both;10 reps Heel Slides: AROM;10 reps;Both Hip ABduction/ADduction: AROM;10 reps;Both Straight Leg Raises: AROM;10 reps;Both Long Arc Quad: AROM;Both;10 reps Knee Flexion: AROM;Both;10 reps Other Exercises Other Exercises: HEP education for BLE APs, GS, and QS x 10 each every 1-2 hrs daily Other Exercises: Extensive education provided to pt regarding physiological benefits of activity with OOB time encouraged    General Comments  Pertinent Vitals/Pain Pain Assessment: No/denies pain    Home Living                      Prior Function            PT Goals (current goals can now be found in the care plan section) Progress towards PT goals: Progressing toward goals    Frequency    Min 2X/week      PT Plan Current plan remains appropriate    Co-evaluation              AM-PAC PT "6 Clicks" Mobility   Outcome Measure  Help needed turning from your back to your side while in a flat bed without using bedrails?: None Help needed moving  from lying on your back to sitting on the side of a flat bed without using bedrails?: None Help needed moving to and from a bed to a chair (including a wheelchair)?: A Little Help needed standing up from a chair using your arms (e.g., wheelchair or bedside chair)?: A Little Help needed to walk in hospital room?: A Little Help needed climbing 3-5 steps with a railing? : A Little 6 Click Score: 20    End of Session Equipment Utilized During Treatment: Oxygen;Gait belt Activity Tolerance: Patient tolerated treatment well Patient left: in chair;with call bell/phone within reach Nurse Communication: Mobility status;Other (comment)(Per nsg, pt does not need chair alarm) PT Visit Diagnosis: Muscle weakness (generalized) (M62.81);History of falling (Z91.81)     Time: 1130-1157 PT Time Calculation (min) (ACUTE ONLY): 27 min  Charges:  $Therapeutic Exercise: 8-22 mins $Therapeutic Activity: 8-22 mins                     D. Scott Tymarion Everard PT, DPT 10/08/18, 12:16 PM

## 2018-10-08 NOTE — Progress Notes (Signed)
[facility] Cardiology  SUBJECTIVE: Patient laying in bed, denies chest pain or shortness of breath, appears to be doing better   Vitals:   10/07/18 2200 10/08/18 0500 10/08/18 0600 10/08/18 0727  BP: 95/71  110/88 99/76  Pulse: 64 (!) 52 100 84  Resp: 17 14 15    Temp:      TempSrc:      SpO2: 100% 97% 96%   Weight:      Height:         Intake/Output Summary (Last 24 hours) at 10/08/2018 0757 Last data filed at 10/08/2018 9924 Gross per 24 hour  Intake 720 ml  Output 250 ml  Net 470 ml      PHYSICAL EXAM  General: Well developed, well nourished, in no acute distress HEENT:  Normocephalic and atramatic Neck:  No JVD.  Lungs: Bilateral coarse rhonchi Heart: HRRR . Normal S1 and S2 without gallops or murmurs.  Abdomen: Bowel sounds are positive, abdomen soft and non-tender  Msk:  Back normal, normal gait. Normal strength and tone for age. Extremities: No clubbing, cyanosis or edema.   Neuro: Alert and oriented X 3. Psych:  Good affect, responds appropriately   LABS: Basic Metabolic Panel: Recent Labs    10/06/18 0543 10/08/18 0443  NA 140 140  K 4.6 4.7  CL 110 110  CO2 25 27  GLUCOSE 115* 133*  BUN 35* 36*  CREATININE 1.16 1.21  CALCIUM 8.0* 7.4*  MG  --  2.3   Liver Function Tests: No results for input(s): AST, ALT, ALKPHOS, BILITOT, PROT, ALBUMIN in the last 72 hours. No results for input(s): LIPASE, AMYLASE in the last 72 hours. CBC: Recent Labs    10/06/18 0543 10/08/18 0443  WBC 17.2* 14.3*  HGB 12.6* 12.3*  HCT 40.2 39.7  MCV 101.0* 102.1*  PLT 179 171   Cardiac Enzymes: No results for input(s): CKTOTAL, CKMB, CKMBINDEX, TROPONINI in the last 72 hours. BNP: Invalid input(s): POCBNP D-Dimer: No results for input(s): DDIMER in the last 72 hours. Hemoglobin A1C: No results for input(s): HGBA1C in the last 72 hours. Fasting Lipid Panel: No results for input(s): CHOL, HDL, LDLCALC, TRIG, CHOLHDL, LDLDIRECT in the last 72 hours. Thyroid Function  Tests: No results for input(s): TSH, T4TOTAL, T3FREE, THYROIDAB in the last 72 hours.  Invalid input(s): FREET3 Anemia Panel: No results for input(s): VITAMINB12, FOLATE, FERRITIN, TIBC, IRON, RETICCTPCT in the last 72 hours.  No results found.   Echo LVEF 30 to 35%  TELEMETRY: Sinus arrhythmia at 70 bpm:  ASSESSMENT AND PLAN:  Principal Problem:   Sepsis ([place]) Active Problems:   CAP (community acquired pneumonia)   HTN (hypertension)   HLD (hyperlipidemia)   COPD with acute exacerbation (HCC)   CAD (coronary artery disease)   Acute respiratory failure with hypoxia (HCC)   Gross hematuria   History of prostate cancer   Kidney stones    1.  Mildly elevated troponin, (0.04, 0.20, 0.84, 1.1 ), in the absence of chest pain or new ischemic ECG changes, in the setting of respiratory failure with hypoxia, likely demand supply ischemia 2.  Story failure with hypoxia, multifactorial, secondary to COPD exacerbation, pneumonia, and acute on chronic systolic congestive heart failure, improving, off of BiPAP, on O2 by nasal cannula 3.  Acute on chronic systolic congestive heart failure, with interstitial edema, and small bilateral pleural effusions 4.  Known CAD, status post CABG, currently without chest pain  Recommendations  1.  Agree with current therapy 2.  Diuresis  as needed 3.  Continue carvedilol 4.  Continue lisinopril 5.  Add low-dose spironolactone 12.5 mg daily 6.  Defer full dose anticoagulation 7.  Defer cardiac catheterization at this time   Isaias Cowman, MD, PhD, [facility] 10/08/2018 7:57 AM

## 2018-10-08 NOTE — Progress Notes (Addendum)
[place] at [place] NAME: Alan Ortega    MR#:  [id number]  DATE OF BIRTH:  Aug 12, 1934  SUBJECTIVE:  CHIEF COMPLAINT: Doing much better.  Denies any chest pain or palpitation  REVIEW OF SYSTEMS:   CONSTITUTIONAL: No documented fever. No fatigue, weakness. No weight gain, no weight loss.  EYES: No blurry or double vision.  ENT: No tinnitus. No postnasal drip. No redness of the oropharynx.  RESPIRATORY: No cough, no wheeze, no hemoptysis. + dyspnea.  CARDIOVASCULAR: No chest pain. No orthopnea. No palpitations. No syncope.  GASTROINTESTINAL: No nausea, no vomiting or diarrhea. No abdominal pain. No melena or hematochezia.  GENITOURINARY:  No urgency. No frequency. No dysuria. No hematuria. No obstructive symptoms. No discharge. No pain. No significant abnormal bleeding ENDOCRINE: No polyuria or nocturia. No heat or cold intolerance.  HEMATOLOGY: No anemia. No bruising. No bleeding. No purpura. No petechiae INTEGUMENTARY: No rashes. No lesions.  MUSCULOSKELETAL: No arthritis. No swelling. No gout.  NEUROLOGIC: No numbness, tingling, or ataxia. No seizure-type activity.  PSYCHIATRIC: No anxiety. No insomnia. No ADD.     DRUG ALLERGIES:  No Known Allergies  VITALS:  Blood pressure (!) 123/93, pulse (!) 102, temperature 98.1 F (36.7 C), temperature source Oral, resp. rate 20, height 5\' 7"  (1.702 m), weight 64.7 kg, SpO2 94 %.  PHYSICAL EXAMINATION:  GENERAL:  85 y.o.-year-old patient lying in the bed with no acute distress.  EYES: Pupils equal, round, reactive to light and accommodation. No scleral icterus. Extraocular muscles intact.  HEENT: Head atraumatic, normocephalic. Oropharynx and nasopharynx clear.  NECK:  Supple, no jugular venous distention. LUNGS: Diminished breath sounds bilaterally, no wheezing, positive rales,rhonchi or crepitation.  No use of accessory muscles of respiration.  CARDIOVASCULAR: S1, S2 normal. No  murmurs, rubs, or gallops.  ABDOMEN: Soft, nontender, nondistended. Bowel sounds present.  EXTREMITIES: No pedal edema, cyanosis, or clubbing.  NEUROLOGIC: Awake, alert and oriented x3 sensation intact. Gait not checked.  PSYCHIATRIC: The patient is alert and oriented x 3.  SKIN: No obvious rash, lesion, or ulcer.    LABORATORY PANEL:   CBC Recent Labs  Lab 10/08/18 0443  WBC 14.3*  HGB 12.3*  HCT 39.7  PLT 171   ------------------------------------------------------------------------------------------------------------------  Chemistries  Recent Labs  Lab 10/03/18 2115  10/08/18 0443  NA 142   < > 140  K 3.0*   < > 4.7  CL 107   < > 110  CO2 23   < > 27  GLUCOSE 139*   < > 133*  BUN 21   < > 36*  CREATININE 1.28*   < > 1.21  CALCIUM 8.4*   < > 7.4*  MG  --    < > 2.3  AST 18  --   --   ALT 9  --   --   ALKPHOS 69  --   --   BILITOT 1.5*  --   --    < > = values in this interval not displayed.   ------------------------------------------------------------------------------------------------------------------  Cardiac Enzymes Recent Labs  Lab 10/04/18 2213  TROPONINI 0.82*   ------------------------------------------------------------------------------------------------------------------  RADIOLOGY:  Dg Chest Port 1 View  Result Date: 10/08/2018 CLINICAL DATA:  SOB today. Hx of HTN, COPD, CAD, prostate cancer, CABG. Nonsmoker. EXAM: PORTABLE CHEST 1 VIEW COMPARISON:  10/04/2018 FINDINGS: Stable changes from prior CABG surgery. The cardiac silhouette is normal in size. No mediastinal or hilar masses. There is lucency in the upper lungs consistent  with emphysema. Are thickened bronchovascular markings in the bases as well as interstitial thickening. There is mild hazy opacity in both lung bases. This is similar to the prior exam consistent with chronic fibrosis. Can not exclude a component of superimposed interstitial pulmonary edema. There are possible small  effusions. No pneumothorax. Skeletal structures are grossly intact. IMPRESSION: 1. Interstitial thickening in the lower lungs with mild hazy opacity at the lung bases, the latter finding suggesting small effusions. Changes are superimposed on COPD/emphysema. Interstitial thickening may be chronic or reflect a combination of chronic changes and mild interstitial edema. No convincing pneumonia. Electronically Signed   By: Lajean Manes M.D.   On: 10/08/2018 09:07    EKG:   Orders placed or performed during the hospital encounter of 10/03/18  . ED EKG within 10 minutes  . ED EKG within 10 minutes  . EKG 12-Lead  . EKG 12-Lead  . EKG    ASSESSMENT AND PLAN:    1. Acute respiratory failure with hypoxia secondary to COPD exacerbation and left-sided pneumonia and chronic interstitial fibrosis Off  BiPAP Continue IV Solu-Medrol, bronchodilator treatments continued and IV antibiotics  Echocardiogram systolic dysfunction  2.  Possible pneumonia continue antibiotics  3.  COPD with acute exacerbation (HCC) -IV antibiotics as above, IV Solu-Medrol,    4.  Acute systolic CHF which is a new diagnosis for patient diuresis as tolerated  5. Hematuria  Has improved appreciate urology input  6.  HTN (hypertension) -continue home dose antihypertensives     All the records are reviewed and case discussed with Care Management/Social Workerr. Management plans discussed with the patient, RN and Dr. Mortimer Fries and they are in agreement.  CODE STATUS: fc  TOTAL TIME TAKING CARE OF THIS PATIENT: 35 minutes.   POSSIBLE D/C IN 1-2 DAYS, DEPENDING ON CLINICAL CONDITION.  Note: This dictation was prepared with Dragon dictation along with smaller phrase technology. Any transcriptional errors that result from this process are unintentional.   Dustin Flock M.D on 10/08/2018 at 3:25 PM  Between 7am to 6pm - Pager - [phone] After 6pm go to [email or web address] - password [email or web address] [facility]   Office  [phone]  CC: Primary care physician; [name], MD

## 2018-10-09 MED ORDER — IPRATROPIUM-ALBUTEROL 0.5-2.5 (3) MG/3ML IN SOLN
3.0000 mL | Freq: Four times a day (QID) | RESPIRATORY_TRACT | Status: DC
  Administered 2018-10-09 – 2018-10-10 (×5): 3 mL via RESPIRATORY_TRACT
  Filled 2018-10-09 (×5): qty 3

## 2018-10-09 NOTE — Progress Notes (Signed)
Patient resting comfortably in bed, request to stay on 2L [place] unless he becomes distressed. Patient has normal respirations, 97% SAT on 2L [place]. Will continue to monitor.

## 2018-10-09 NOTE — Progress Notes (Addendum)
[place] at [place] NAME: Alan Ortega    MR#:  [id number]  DATE OF BIRTH:  07/30/1934  SUBJECTIVE:  CHIEF COMPLAINT:  Pt sob improved, patient denies any complaints  REVIEW OF SYSTEMS:   CONSTITUTIONAL: No documented fever. No fatigue, weakness. No weight gain, no weight loss.  EYES: No blurry or double vision.  ENT: No tinnitus. No postnasal drip. No redness of the oropharynx.  RESPIRATORY: No cough, no wheeze, no hemoptysis. + dyspnea.  CARDIOVASCULAR: No chest pain. No orthopnea. No palpitations. No syncope.  GASTROINTESTINAL: No nausea, no vomiting or diarrhea. No abdominal pain. No melena or hematochezia.  GENITOURINARY:  No urgency. No frequency. No dysuria. No hematuria. No obstructive symptoms. No discharge. No pain. No significant abnormal bleeding ENDOCRINE: No polyuria or nocturia. No heat or cold intolerance.  HEMATOLOGY: No anemia. No bruising. No bleeding. No purpura. No petechiae INTEGUMENTARY: No rashes. No lesions.  MUSCULOSKELETAL: No arthritis. No swelling. No gout.  NEUROLOGIC: No numbness, tingling, or ataxia. No seizure-type activity.  PSYCHIATRIC: No anxiety. No insomnia. No ADD.     DRUG ALLERGIES:  No Known Allergies  VITALS:  Blood pressure (!) 144/82, pulse 84, temperature 98.6 F (37 C), resp. rate 14, height 5\' 7"  (1.702 m), weight 64.7 kg, SpO2 96 %.  PHYSICAL EXAMINATION:  GENERAL:  85 y.o.-year-old patient lying in the bed with no acute distress.  EYES: Pupils equal, round, reactive to light and accommodation. No scleral icterus. Extraocular muscles intact.  HEENT: Head atraumatic, normocephalic. Oropharynx and nasopharynx clear.  NECK:  Supple, no jugular venous distention. LUNGS: Diminished breath sounds bilaterally, no wheezing, positive rales,rhonchi or crepitation.  No use of accessory muscles of respiration.  CARDIOVASCULAR: S1, S2 normal. No murmurs, rubs, or gallops.  ABDOMEN: Soft,  nontender, nondistended. Bowel sounds present.  EXTREMITIES: No pedal edema, cyanosis, or clubbing.  NEUROLOGIC: Awake, alert and oriented x3 sensation intact. Gait not checked.  PSYCHIATRIC: The patient is alert and oriented x 3.  SKIN: No obvious rash, lesion, or ulcer.    LABORATORY PANEL:   CBC Recent Labs  Lab 10/08/18 0443  WBC 14.3*  HGB 12.3*  HCT 39.7  PLT 171   ------------------------------------------------------------------------------------------------------------------  Chemistries  Recent Labs  Lab 10/03/18 2115  10/08/18 0443  NA 142   < > 140  K 3.0*   < > 4.7  CL 107   < > 110  CO2 23   < > 27  GLUCOSE 139*   < > 133*  BUN 21   < > 36*  CREATININE 1.28*   < > 1.21  CALCIUM 8.4*   < > 7.4*  MG  --    < > 2.3  AST 18  --   --   ALT 9  --   --   ALKPHOS 69  --   --   BILITOT 1.5*  --   --    < > = values in this interval not displayed.   ------------------------------------------------------------------------------------------------------------------  Cardiac Enzymes Recent Labs  Lab 10/04/18 2213  TROPONINI 0.82*   ------------------------------------------------------------------------------------------------------------------  RADIOLOGY:  Dg Chest Port 1 View  Result Date: 10/08/2018 CLINICAL DATA:  SOB today. Hx of HTN, COPD, CAD, prostate cancer, CABG. Nonsmoker. EXAM: PORTABLE CHEST 1 VIEW COMPARISON:  10/04/2018 FINDINGS: Stable changes from prior CABG surgery. The cardiac silhouette is normal in size. No mediastinal or hilar masses. There is lucency in the upper lungs consistent with emphysema. Are thickened bronchovascular markings  in the bases as well as interstitial thickening. There is mild hazy opacity in both lung bases. This is similar to the prior exam consistent with chronic fibrosis. Can not exclude a component of superimposed interstitial pulmonary edema. There are possible small effusions. No pneumothorax. Skeletal structures  are grossly intact. IMPRESSION: 1. Interstitial thickening in the lower lungs with mild hazy opacity at the lung bases, the latter finding suggesting small effusions. Changes are superimposed on COPD/emphysema. Interstitial thickening may be chronic or reflect a combination of chronic changes and mild interstitial edema. No convincing pneumonia. Electronically Signed   By: Lajean Manes M.D.   On: 10/08/2018 09:07    EKG:   Orders placed or performed during the hospital encounter of 10/03/18  . ED EKG within 10 minutes  . ED EKG within 10 minutes  . EKG 12-Lead  . EKG 12-Lead  . EKG    ASSESSMENT AND PLAN:    1. Acute respiratory failure with hypoxia secondary to COPD exacerbation and left-sided pneumonia and chronic interstitial fibrosis Continue IV Solu-Medrol, continue bronchodilators  2.  Possible pneumonia continue antibiotics  3.  COPD with acute exacerbation (HCC) -IV antibiotics as above, IV Solu-Medrol,    4.  Acute systolic CHF which is a new diagnosis for patient diuresis  Start low-dose oral Lasix  5. Hematuria  Has resolved  6.  HTN (hypertension) -continue home dose antihypertensives    I updated patient's daughter and plan for possible discharge discussed  All the records are reviewed and case discussed with Care Management/Social Workerr. Management plans discussed with the patient, RN and Dr. Mortimer Fries and they are in agreement.  CODE STATUS: fc  TOTAL TIME TAKING CARE OF THIS PATIENT: 35 minutes.   POSSIBLE D/C IN 1-2 DAYS, DEPENDING ON CLINICAL CONDITION.  Note: This dictation was prepared with Dragon dictation along with smaller phrase technology. Any transcriptional errors that result from this process are unintentional.   Dustin Flock M.D on 10/09/2018 at 12:13 PM  Between 7am to 6pm - Pager - [phone] After 6pm go to [email or web address] - password [email or web address] [facility]  Office  [phone]  CC: Primary care physician; [name], MD

## 2018-10-09 NOTE — Progress Notes (Signed)
Bipap order discontinued per NP orders

## 2018-10-09 NOTE — Progress Notes (Signed)
Advanced care plan.  Purpose of the Encounter: CODE STATUS  Parties in Attendance: Patient's daughter Faysal Fenoglio  Patient's Decision Capacity: Not intact  Subjective/Patient's story: Patient is 85 year old with COPD, pulmonary fibrosis presenting with shortness of breath noted to have new onset systolic CHF   Objective/Medical story  I discussed with the patient's daughter regarding overall poor prognosis.  Recommended patient be considered for DO NOT RESUSCITATE  Goals of care determination:  Daughter agrees to DO NOT RESUSCITATE status as well as outpatient hospice services if he were to deteriorate   CODE STATUS: DNR   Time spent discussing advanced care planning: 16 minutes

## 2018-10-10 ENCOUNTER — Other Ambulatory Visit: Payer: Self-pay

## 2018-10-10 LAB — CREATININE, SERUM
Creatinine, Ser: 1.02 mg/dL (ref 0.61–1.24)
GFR calc Af Amer: >60 mL/min (ref >60)
GFR calc non Af Amer: >60 mL/min (ref >60)

## 2018-10-10 MED ORDER — DOXYCYCLINE HYCLATE 100 MG PO TABS
100.0000 mg | ORAL_TABLET | Freq: Two times a day (BID) | ORAL | Status: DC
  Administered 2018-10-10: 100 mg via ORAL
  Filled 2018-10-10: qty 1

## 2018-10-10 MED ORDER — DOXYCYCLINE HYCLATE 100 MG PO TABS: 100.0000 mg | ORAL_TABLET | Freq: Two times a day (BID) | ORAL | 0 refills | Status: AC

## 2018-10-10 MED ORDER — IPRATROPIUM-ALBUTEROL 0.5-2.5 (3) MG/3ML IN SOLN: 3.0000 mL | Freq: Four times a day (QID) | RESPIRATORY_TRACT | 2 refills | Status: DC

## 2018-10-10 MED ORDER — TIOTROPIUM BROMIDE MONOHYDRATE 18 MCG IN CAPS: 18.0000 ug | ORAL_CAPSULE | Freq: Every day | RESPIRATORY_TRACT | 11 refills | Status: DC

## 2018-10-10 MED ORDER — PREDNISONE 10 MG (21) PO TBPK: ORAL_TABLET | ORAL | 0 refills | Status: DC

## 2018-10-10 MED ORDER — CARVEDILOL 3.125 MG PO TABS: 3.1250 mg | ORAL_TABLET | Freq: Two times a day (BID) | ORAL | 0 refills | Status: DC

## 2018-10-10 MED ORDER — ALPRAZOLAM 0.5 MG PO TABS: 0.5000 mg | ORAL_TABLET | Freq: Three times a day (TID) | ORAL | 0 refills | Status: DC | PRN

## 2018-10-10 MED ORDER — BUDESONIDE 0.25 MG/2ML IN SUSP
0.2500 mg | Freq: Two times a day (BID) | RESPIRATORY_TRACT | Status: DC
  Administered 2018-10-10: 0.25 mg via RESPIRATORY_TRACT
  Filled 2018-10-10: qty 2

## 2018-10-10 MED ORDER — GUAIFENESIN-DM 100-10 MG/5ML PO SYRP: 5.0000 mL | ORAL_SOLUTION | ORAL | 0 refills | Status: DC | PRN

## 2018-10-10 MED ORDER — BUDESONIDE 0.25 MG/2ML IN SUSP: 0.2500 mg | Freq: Two times a day (BID) | RESPIRATORY_TRACT | 12 refills | Status: DC

## 2018-10-10 NOTE — Care Management Note (Addendum)
Case Management Note  Patient Details  Name: Alan Ortega MRN: [id number] Date of Birth: 1934/03/12  Subjective/Objective:  Patient to be discharged per MD order. Orders in place for home health services. Patient currently lives with sister and requires frequent assistance with activities of daily living. Family pays for caregivers for around 16 hours a day. Patient has all needed DME. CMS Medicare.gov Compare Post Acute Care list reviewed with patient and referral placed with [facility]. DME nebulizer order in place, obtained from [facility]. Will speak with daughter after church to confirm transition of care plan.  Update: daughter agreeable to home health, prefers [facility]. Cheryl updated. Daughter also inquiring about outpatient palliative services. Per their preference referral faxed to [facility].                 Action/Plan:   Expected Discharge Date:  10/10/18               Expected Discharge Plan:  [name]  In-House Referral:  Clinical Social Work  Discharge planning Services  CM Consult  Post Acute Care Choice:  Home Health, Durable Medical Equipment Choice offered to:  Patient, Sibling  DME Arranged:  [facility] DME Agency:  [place]:  RN, PT, Nurse's Aide, Social Work [employer] Agency:  [employer]  Status of Service:  Completed, signed off  If discussed at [employer] [facility] [employer], dates discussed:    Additional Comments:  Latanya Maudlin, RN 10/10/2018, 10:38 AM

## 2018-10-10 NOTE — Progress Notes (Signed)
CM called to room to talk with family. O2 will not be here soon and patient/family does not want to wait. They said he lives 20 min from here and patient states he will be ok. Notified Dr. Posey Pronto of patient/family decision to transport without oxygen.

## 2018-10-10 NOTE — Discharge Summary (Signed)
Alan Ortega at [facility], 85 y.o., DOB October 29, 1933, MRN [id number]. Admission date: 10/03/2018 Discharge Date 10/10/2018 Primary MD [name], MD Admitting Physician Lance Coon, MD  Admission Diagnosis  Healthcare-associated pneumonia [J18.9] COPD exacerbation ([place]) [J44.1] Congestive heart failure, unspecified HF chronicity, unspecified heart failure type [facility]) [I50.9]  Discharge Diagnosis   Principal Problem:   Sepsis ([place]) due to pneumonia    CAP (community acquired pneumonia)    Acute on chronic respiratory failure Acute on chronic COPD excess Acute systolic CHF this is a new diagnosis for this patient   HTN (hypertension)   HLD (hyperlipidemia)    CAD (coronary artery disease)   Gross hematuria   History of prostate cancer   Kidney stones           Hospital Course  Alan Ortega  is a 85 y.o. male who presents with chief complaint as above.  Patient presents to the ED with progressive shortness of breath.  Patient was evaluated and noted to have pneumonia.  Patient required BiPAP and was in the ICU.  Underwent evaluation with echocardiogram of the heart which showed systolic dysfunction.  He was seen by cardiology.  Medical management was recommended.  Patient prognosis is overall very poor.  I have discussed with the daughter regarding this.  And we will have social worker follow the patient at home.  Daughter is interested in home hospice if his condition were to deteriorate.     Continue 3 L oxygen at home     Consults  pulmonary/intensive care  Significant Tests:  See full reports for all details     Dg Chest 2 View  Result Date: 10/03/2018 CLINICAL DATA:  85 year old with acute onset of productive cough that began last night now associated with a sore throat. Current history of emphysema. EXAM: CHEST - 2 VIEW COMPARISON:  11/20/2014 and earlier, including CT chest 09/13/2013. FINDINGS: AP ERECT and LATERAL  images were obtained. Prior sternotomy for CABG. Cardiac silhouette mildly to moderately enlarged, unchanged. Thoracic aorta atherosclerotic, unchanged. Hilar and mediastinal contours otherwise unremarkable. Severe emphysematous changes throughout both lungs and chronic interstitial fibrosis in the LOWER lungs, unchanged. Superimposed airspace consolidation in the LEFT LOWER LOBE. Pulmonary vascularity normal. Degenerative changes involving the thoracic spine. IMPRESSION: LEFT LOWER LOBE pneumonia superimposed upon severe COPD/emphysema and chronic interstitial fibrosis. Stable cardiomegaly without pulmonary edema. Electronically Signed   By: Evangeline Dakin M.D.   On: 10/03/2018 20:54   Dg Chest Port 1 View  Result Date: 10/08/2018 CLINICAL DATA:  SOB today. Hx of HTN, COPD, CAD, prostate cancer, CABG. Nonsmoker. EXAM: PORTABLE CHEST 1 VIEW COMPARISON:  10/04/2018 FINDINGS: Stable changes from prior CABG surgery. The cardiac silhouette is normal in size. No mediastinal or hilar masses. There is lucency in the upper lungs consistent with emphysema. Are thickened bronchovascular markings in the bases as well as interstitial thickening. There is mild hazy opacity in both lung bases. This is similar to the prior exam consistent with chronic fibrosis. Can not exclude a component of superimposed interstitial pulmonary edema. There are possible small effusions. No pneumothorax. Skeletal structures are grossly intact. IMPRESSION: 1. Interstitial thickening in the lower lungs with mild hazy opacity at the lung bases, the latter finding suggesting small effusions. Changes are superimposed on COPD/emphysema. Interstitial thickening may be chronic or reflect a combination of chronic changes and mild interstitial edema. No convincing pneumonia. Electronically Signed   By: Lajean Manes M.D.   On: 10/08/2018 09:07  Dg Chest Port 1 View  Result Date: 10/04/2018 CLINICAL DATA:  Shortness of breath. EXAM: PORTABLE CHEST 1  VIEW COMPARISON:  10/03/2018.  11/20/2014. FINDINGS: Prior CABG. Cardiomegaly. Diffuse bilateral pulmonary interstitial prominence with Kerley B-lines. Small bilateral pleural effusions. Findings consistent with CHF. Underlying chronic interstitial lung disease may be present. Bibasilar pneumonia can not be excluded. No pneumothorax. IMPRESSION: Prior CABG. Cardiomegaly with bilateral pulmonary interstitial prominence with Kerley B-lines. Small bilateral pleural effusions. These findings suggest CHF. Underlying chronic interstitial lung disease can not be excluded. Bibasilar pneumonia can not be excluded. Electronically Signed   By: Marcello Moores  Register   On: 10/04/2018 09:12       Today   Subjective:   Alan Ortega patient's breathing is improved now he wants to go home  Objective:   Blood pressure 108/79, pulse (!) 56, temperature 97.7 F (36.5 C), temperature source Oral, resp. rate 18, height 5\' 7"  (1.702 m), weight 64.7 kg, SpO2 96 %.  .  Intake/Output Summary (Last 24 hours) at 10/10/2018 1118 Last data filed at 10/10/2018 0300 Gross per 24 hour  Intake 360 ml  Output -  Net 360 ml    Exam VITAL SIGNS: Blood pressure 108/79, pulse (!) 56, temperature 97.7 F (36.5 C), temperature source Oral, resp. rate 18, height 5\' 7"  (1.702 m), weight 64.7 kg, SpO2 96 %.  GENERAL:  85 y.o.-year-old patient lying in the bed with no acute distress.  EYES: Pupils equal, round, reactive to light and accommodation. No scleral icterus. Extraocular muscles intact.  HEENT: Head atraumatic, normocephalic. Oropharynx and nasopharynx clear.  NECK:  Supple, no jugular venous distention. No thyroid enlargement, no tenderness.  LUNGS: Normal breath sounds bilaterally, no wheezing, rales,rhonchi or crepitation. No use of accessory muscles of respiration.  CARDIOVASCULAR: S1, S2 normal. No murmurs, rubs, or gallops.  ABDOMEN: Soft, nontender, nondistended. Bowel sounds present. No organomegaly or mass.   EXTREMITIES: No pedal edema, cyanosis, or clubbing.  NEUROLOGIC: Cranial nerves II through XII are intact. Muscle strength 5/5 in all extremities. Sensation intact. Gait not checked.  PSYCHIATRIC: The patient is alert and oriented x 3.  SKIN: No obvious rash, lesion, or ulcer.   Data Review     CBC w Diff:  Lab Results  Component Value Date   WBC 14.3 (H) 10/08/2018   HGB 12.3 (L) 10/08/2018   HGB 15.1 03/23/2012   HCT 39.7 10/08/2018   HCT 45.5 03/23/2012   PLT 171 10/08/2018   PLT 234 03/23/2012   LYMPHOPCT 22 10/03/2018   LYMPHOPCT 30.2 03/23/2012   MONOPCT 10 10/03/2018   MONOPCT 10.0 03/23/2012   EOSPCT 1 10/03/2018   EOSPCT 3.1 03/23/2012   BASOPCT 0 10/03/2018   BASOPCT 0.8 03/23/2012   CMP:  Lab Results  Component Value Date   NA 140 10/08/2018   NA 141 03/23/2012   K 4.7 10/08/2018   K 3.7 03/23/2012   CL 110 10/08/2018   CL 106 03/23/2012   CO2 27 10/08/2018   CO2 29 03/23/2012   BUN 36 (H) 10/08/2018   BUN 13 03/23/2012   CREATININE 1.02 10/10/2018   CREATININE 1.05 03/23/2012   PROT 7.3 10/03/2018   PROT 7.2 03/23/2012   ALBUMIN 3.8 10/03/2018   ALBUMIN 3.6 03/23/2012   BILITOT 1.5 (H) 10/03/2018   BILITOT 0.4 03/23/2012   ALKPHOS 69 10/03/2018   ALKPHOS 78 03/23/2012   AST 18 10/03/2018   AST 21 03/23/2012   ALT 9 10/03/2018   ALT 16 03/23/2012  .  Micro Results Recent Results (from the past 240 hour(s))  Culture, blood (routine x 2)     Status: None   Collection Time: 10/03/18  9:15 PM  Result Value Ref Range Status   Specimen Description BLOOD BLOOD LEFT FOREARM  Final   Special Requests   Final    BOTTLES DRAWN AEROBIC AND ANAEROBIC Blood Culture adequate volume   Culture   Final    NO GROWTH 5 DAYS Performed at [facility], [address], [place], [place] [address]    Report Status 10/08/2018 FINAL  Final  Culture, blood (routine x 2)     Status: Abnormal   Collection Time: 10/03/18  9:15 PM  Result Value Ref Range  Status   Specimen Description   Final    BLOOD BLOOD LEFT FOREARM Performed at [facility], [address], [place], [place] [address]    Special Requests   Final    BOTTLES DRAWN AEROBIC AND ANAEROBIC Blood Culture adequate volume Performed at [facility], [address], [place], [place] [address]    Culture  Setup Time   Final    GRAM POSITIVE COCCI IN CLUSTERS AEROBIC BOTTLE ONLY Performed at [facility], [address] [place], [place] [address]    Culture (A)  Final    STAPHYLOCOCCUS SPECIES (COAGULASE NEGATIVE) THE SIGNIFICANCE OF ISOLATING THIS ORGANISM FROM A SINGLE SET OF BLOOD CULTURES WHEN MULTIPLE SETS ARE DRAWN IS UNCERTAIN. PLEASE NOTIFY THE MICROBIOLOGY DEPARTMENT WITHIN ONE WEEK IF SPECIATION AND SENSITIVITIES ARE REQUIRED. Performed at [facility], [place] [address], [place], [place] [address]    Report Status 10/07/2018 FINAL  Final  Blood Culture ID Panel (Reflexed)     Status: None   Collection Time: 10/03/18  9:15 PM  Result Value Ref Range Status   Enterococcus species NOT DETECTED NOT DETECTED Final   Vancomycin resistance NOT DETECTED NOT DETECTED Final   Listeria monocytogenes NOT DETECTED NOT DETECTED Final   Staphylococcus species NOT DETECTED NOT DETECTED Final   Staphylococcus aureus (BCID) NOT DETECTED NOT DETECTED Final   Methicillin resistance NOT DETECTED NOT DETECTED Final   Streptococcus species NOT DETECTED NOT DETECTED Final   Streptococcus agalactiae NOT DETECTED NOT DETECTED Final   Streptococcus pneumoniae NOT DETECTED NOT DETECTED Final   Streptococcus pyogenes NOT DETECTED NOT DETECTED Final   Acinetobacter baumannii NOT DETECTED NOT DETECTED Final   Enterobacteriaceae species NOT DETECTED NOT DETECTED Final   Enterobacter cloacae complex NOT DETECTED NOT DETECTED Final   Escherichia coli NOT DETECTED NOT DETECTED Final   Klebsiella oxytoca NOT DETECTED NOT DETECTED Final   Klebsiella pneumoniae NOT  DETECTED NOT DETECTED Final   Proteus species NOT DETECTED NOT DETECTED Final   Serratia marcescens NOT DETECTED NOT DETECTED Final   Carbapenem resistance NOT DETECTED NOT DETECTED Final   Haemophilus influenzae NOT DETECTED NOT DETECTED Final   Neisseria meningitidis NOT DETECTED NOT DETECTED Final   Pseudomonas aeruginosa NOT DETECTED NOT DETECTED Final   Candida albicans NOT DETECTED NOT DETECTED Final   Candida glabrata NOT DETECTED NOT DETECTED Final   Candida krusei NOT DETECTED NOT DETECTED Final   Candida parapsilosis NOT DETECTED NOT DETECTED Final   Candida tropicalis NOT DETECTED NOT DETECTED Final    Comment: Performed at [facility], [address], [place], [place] [address]  MRSA PCR Screening     Status: None   Collection Time: 10/04/18  1:01 AM  Result Value Ref Range Status  MRSA by PCR NEGATIVE NEGATIVE Final    Comment:        The GeneXpert MRSA Assay (FDA approved for NASAL specimens only), is one component of a comprehensive MRSA colonization surveillance program. It is not intended to diagnose MRSA infection nor to guide or monitor treatment for MRSA infections. Performed at [facility], [address] [place], [place] [address]         Code Status Orders  (From admission, onward)         Start     Ordered   10/09/18 1220  Do not attempt resuscitation (DNR)  Continuous    Question Answer Comment  In the event of cardiac or respiratory ARREST Do not call a "code blue"   In the event of cardiac or respiratory ARREST Do not perform Intubation, CPR, defibrillation or ACLS   In the event of cardiac or respiratory ARREST Use medication by any route, position, wound care, and other measures to relive pain and suffering. May use oxygen, suction and manual treatment of airway obstruction as needed for comfort.      10/09/18 1219        Code Status History    Date Active Date Inactive Code Status Order ID Comments User Context    10/04/2018 0048 10/09/2018 1219 Full Code [id number]  Lance Coon, MD Inpatient          Follow-up Information    [name], MD Follow up in 1 week(s).   Specialty:  Internal Medicine Contact information: [place]  [address] [phone]           Discharge Medications   Allergies as of 10/10/2018   No Known Allergies     Medication List    TAKE these medications   ALPRAZolam 0.5 MG tablet Commonly known as:  XANAX Take 1 tablet (0.5 mg total) by mouth 3 (three) times daily as needed for anxiety.   aspirin EC 81 MG tablet Take 81 mg by mouth at bedtime.   budesonide 0.25 MG/2ML nebulizer solution Commonly known as:  PULMICORT Take 2 mLs (0.25 mg total) by nebulization 2 (two) times daily.   carvedilol 3.125 MG tablet Commonly known as:  COREG Take 1 tablet (3.125 mg total) by mouth 2 (two) times daily with a meal.   doxycycline 100 MG tablet Commonly known as:  VIBRA-TABS Take 1 tablet (100 mg total) by mouth every 12 (twelve) hours for 5 days.   escitalopram 10 MG tablet Commonly known as:  LEXAPRO Take 10 mg by mouth at bedtime.   guaiFENesin-dextromethorphan 100-10 MG/5ML syrup Commonly known as:  ROBITUSSIN DM Take 5 mLs by mouth every 4 (four) hours as needed for cough.   ipratropium-albuterol 0.5-2.5 (3) MG/3ML Soln Commonly known as:  DUONEB Take 3 mLs by nebulization every 6 (six) hours.   predniSONE 10 MG (21) Tbpk tablet Commonly known as:  STERAPRED UNI-PAK 21 TAB Start at 60mg  taper by 10mg  until complete   simvastatin 20 MG tablet Commonly known as:  ZOCOR Take 20 mg by mouth at bedtime.   tiotropium 18 MCG inhalation capsule Commonly known as:  SPIRIVA HANDIHALER Place 1 capsule (18 mcg total) into inhaler and inhale daily.            Durable Medical Equipment  (From admission, onward)         Start     Ordered   10/10/18 1006  For home use only DME Nebulizer machine  Once  Question:   Patient needs a nebulizer to treat with the following condition  Answer:  COPD (chronic obstructive pulmonary disease) ([place])   10/10/18 1007             Total Time in preparing paper work, data evaluation and todays exam - 49 minutes  Alan Ortega M.D on 10/10/2018 at [facility]  [phone]

## 2018-10-10 NOTE — Progress Notes (Signed)
Patient is being discharged to home today to sisters house. IV removed, DC & Rx instructions given and family acknowledged understanding. Waiting for case manager to bring O2 so family can take him home.

## 2018-10-11 ENCOUNTER — Telehealth: Payer: Self-pay | Admitting: Urology

## 2018-10-11 NOTE — Telephone Encounter (Signed)
-----   Message from Hollice Espy, MD sent at 10/08/2018  9:43 AM EST ----- Regarding: f/u cysto This is a current inpatient he needs outpatient cystoscopy with me when he is discharged.  It is nonurgent.  Please arrange.  Hollice Espy, MD

## 2018-10-11 NOTE — Telephone Encounter (Signed)
Called patient to schedule and his daughter stated that unless we could put him under he would not be having a cysto done. She said he didn't need follow up but would discuss it with him and other family members and call me back.   Sharyn Lull

## 2018-10-14 ENCOUNTER — Ambulatory Visit: Payer: Self-pay | Admitting: Urology

## 2018-10-15 ENCOUNTER — Encounter: Payer: Self-pay | Admitting: Urology

## 2018-10-15 ENCOUNTER — Ambulatory Visit (INDEPENDENT_AMBULATORY_CARE_PROVIDER_SITE_OTHER): Payer: Medicare HMO | Admitting: Urology

## 2018-10-15 VITALS — BP 154/75 | HR 81 | Ht 66.0 in

## 2018-10-15 DIAGNOSIS — Z8546 Personal history of malignant neoplasm of prostate: Secondary | ICD-10-CM

## 2018-10-15 DIAGNOSIS — N200 Calculus of kidney: Secondary | ICD-10-CM

## 2018-10-15 DIAGNOSIS — R310 Gross hematuria: Secondary | ICD-10-CM

## 2018-10-15 LAB — URINALYSIS, COMPLETE
Bilirubin, UA: NEGATIVE
GLUCOSE, UA: NEGATIVE
Ketones, UA: NEGATIVE
Nitrite, UA: NEGATIVE
Specific Gravity, UA: 1.02 (ref 1.005–1.030)
Urobilinogen, Ur: 0.2 mg/dL (ref 0.2–1.0)
pH, UA: 6 (ref 5.0–7.5)

## 2018-10-15 LAB — BLADDER SCAN AMB NON-IMAGING

## 2018-10-15 LAB — MICROSCOPIC EXAMINATION

## 2018-10-15 NOTE — Progress Notes (Signed)
10/15/2018 11:56 AM   Alan Ortega 1934/02/07 [id number]  Referring provider: [name], MD [address] [facility] [place], [place] [address]  Chief Complaint  Patient presents with  . Gross Hematuria    hospital follow up    HPI:  Pt returns today for gross hematuria. His caregiver noted bloody urine in the diaper this morning. It is intermittent. He has no trouble voiding. He has severe stress incontinence, wears diaper. PVR today = 0. He has no flank pain. UA today shows only 3-10 rbc's / hpf. No constipation.   He was seen by Dr. [name] last week in hospital. He was admitted to ICU for sepsis/ bacteremia with gross hematuria x 2 days after traumatic cath. His hgb was 12.3 and Cr 1.21 with gfr > 60. He had pneumonia and is still on steroids.   He has a history of prostate cancer. Remote history of prostatectomy (Dr. [name] Dr. [name]) followed by radiation ? [facility]age vs. [facility]jvant.  Most recent PSA <0.01 Oct 06, 2018. He also has a history of right staghorn stone last imaged in 2016. I reviewed his CT scan images today.   PMH: Past Medical History:  Diagnosis Date  . CAD (coronary artery disease)   . Cancer [facility])    Prostate  . COPD (chronic obstructive pulmonary disease) ([place])   . HLD (hyperlipidemia)   . Hypertension     Surgical History: Past Surgical History:  Procedure Laterality Date  . CHOLECYSTECTOMY    . COLONOSCOPY    . CORONARY ANGIOPLASTY    . CORONARY ARTERY BYPASS GRAFT    . EXCISION MASS LOWER EXTREMETIES Right 09/09/2018   Procedure: EXCISION MASS LOWER EXTREMETIES;  Surgeon: Benjamine Sprague, DO;  Location: [facility] ORS;  Service: General;  Laterality: Right;  . TONSILLECTOMY      Home Medications:  Allergies as of 10/15/2018   No Known Allergies     Medication List       Accurate as of October 15, 2018 11:56 AM. Always use your most recent med list.        ALPRAZolam 0.5 MG tablet Commonly known as:  XANAX Take 1  tablet (0.5 mg total) by mouth 3 (three) times daily as needed for anxiety.   aspirin EC 81 MG tablet Take 81 mg by mouth at bedtime.   budesonide 0.25 MG/2ML nebulizer solution Commonly known as:  PULMICORT Take 2 mLs (0.25 mg total) by nebulization 2 (two) times daily.   carvedilol 3.125 MG tablet Commonly known as:  COREG Take 1 tablet (3.125 mg total) by mouth 2 (two) times daily with a meal.   doxycycline 100 MG tablet Commonly known as:  VIBRA-TABS Take 1 tablet (100 mg total) by mouth every 12 (twelve) hours for 5 days.   escitalopram 10 MG tablet Commonly known as:  LEXAPRO Take 10 mg by mouth at bedtime.   guaiFENesin-dextromethorphan 100-10 MG/5ML syrup Commonly known as:  ROBITUSSIN DM Take 5 mLs by mouth every 4 (four) hours as needed for cough.   ipratropium-albuterol 0.5-2.5 (3) MG/3ML Soln Commonly known as:  DUONEB Take 3 mLs by nebulization every 6 (six) hours.   nystatin powder Commonly known as:  MYCOSTATIN/NYSTOP Apply topically.   predniSONE 10 MG (21) Tbpk tablet Commonly known as:  STERAPRED UNI-PAK 21 TAB Start at 60mg  taper by 10mg  until complete   simvastatin 20 MG tablet Commonly known as:  ZOCOR Take 20 mg by mouth at bedtime.   tiotropium 18 MCG inhalation  capsule Commonly known as:  SPIRIVA HANDIHALER Place 1 capsule (18 mcg total) into inhaler and inhale daily.       Allergies: No Known Allergies  Family History: Family History  Problem Relation Age of Onset  . Heart disease Mother   . Stroke Father   . Kidney disease Brother     Social History:  reports that he has never smoked. He has never used smokeless tobacco. He reports that he does not drink alcohol or use drugs.  ROS: UROLOGY Frequent Urination?: Yes Hard to postpone urination?: No Burning/pain with urination?: No Get up at night to urinate?: No Leakage of urine?: No Urine stream starts and stops?: No Trouble starting stream?: No Do you have to strain to  urinate?: No Blood in urine?: Yes Urinary tract infection?: No Sexually transmitted disease?: No Injury to kidneys or bladder?: No Painful intercourse?: No Weak stream?: No Erection problems?: No Penile pain?: No  Gastrointestinal Nausea?: No Vomiting?: No Indigestion/heartburn?: No Diarrhea?: No Constipation?: No  Constitutional Fever: No Night sweats?: No Weight loss?: No Fatigue?: No  Skin Skin rash/lesions?: No Itching?: No  Eyes Blurred vision?: No Double vision?: No  Ears/Nose/Throat Sore throat?: No Sinus problems?: No  Hematologic/Lymphatic Swollen glands?: No Easy bruising?: No  Cardiovascular Leg swelling?: No Chest pain?: No  Respiratory Cough?: No Shortness of breath?: No  Endocrine Excessive thirst?: No  Musculoskeletal Back pain?: No Joint pain?: No  Neurological Headaches?: No Dizziness?: No  Psychologic Depression?: No Anxiety?: Yes  Physical Exam: BP (!) 154/75 (BP Location: Left Arm, Patient Position: Sitting)   Pulse 81   Ht 5\' 6"  (1.676 m)   BMI 23.02 kg/m   Constitutional:  Alert and oriented, No acute distress. HEENT: [place] AT, moist mucus membranes.  Trachea midline, no masses. Cardiovascular: No clubbing, cyanosis, or edema. Respiratory: Normal respiratory effort, no increased work of breathing. GI: Abdomen is soft, nontender, nondistended, no abdominal masses GU: No CVA tenderness Lymph: No cervical or inguinal lymphadenopathy. Skin: No rashes, bruises or suspicious lesions. Neurologic: Grossly intact, no focal deficits, moving all 4 extremities. Psychiatric: Normal mood and affect. GU: penis appears normal, diaper with tea colored urine. No clots. Diaper is wet.   Laboratory Data: Lab Results  Component Value Date   WBC 14.3 (H) 10/08/2018   HGB 12.3 (L) 10/08/2018   HCT 39.7 10/08/2018   MCV 102.1 (H) 10/08/2018   PLT 171 10/08/2018    Lab Results  Component Value Date   CREATININE 1.02 10/10/2018     No results found for: PSA  No results found for: TESTOSTERONE  No results found for: HGBA1C  Urinalysis    Component Value Date/Time   COLORURINE Yellow 03/04/2012 0025   APPEARANCEUR Hazy 03/04/2012 0025   LABSPEC 1.013 03/04/2012 0025   PHURINE 6.0 03/04/2012 0025   GLUCOSEU 50 mg/dL 03/04/2012 0025   HGBUR 2+ 03/04/2012 0025   BILIRUBINUR Negative 03/04/2012 0025   KETONESUR Negative 03/04/2012 0025   PROTEINUR 100 mg/dL 03/04/2012 0025   NITRITE Negative 03/04/2012 0025   LEUKOCYTESUR Trace 03/04/2012 0025    Lab Results  Component Value Date   BACTERIA TRACE 03/04/2012    Pertinent Imaging: CT 2016  No results found for this or any previous visit. No results found for this or any previous visit. No results found for this or any previous visit. No results found for this or any previous visit. No results found for this or any previous visit. No results found for this or any previous  visit. No results found for this or any previous visit. No results found for this or any previous visit.  Assessment & Plan:    1. Gross hematuria Discussed evaluation with CT and cystoscopy and he elects to proceed. Vitals stable. He "doesn't want a transfusion".   2. Kidney stone - as above. Eval with CT. Pt said it's not bothering him so "dont disturb it".  - Urinalysis, Complete - BLADDER SCAN AMB NON-IMAGING  3. H/o PCa - PSA undetectable    No follow-ups on file.  Festus Aloe, MD  [facility] [address] [place], [place] [address] [phone]

## 2018-10-25 NOTE — Telephone Encounter (Signed)
Patient has declined to have anything done at this time. He does not want to have to CT or the Cysto done. Per his daughter Arrie Aran they would like to cancel both appts. They do not want to reschedule anything.   Sharyn Lull

## 2018-11-12 ENCOUNTER — Other Ambulatory Visit: Payer: Medicare HMO | Admitting: Urology

## 2019-08-26 ENCOUNTER — Other Ambulatory Visit: Payer: Self-pay

## 2019-08-26 DIAGNOSIS — Z20822 Contact with and (suspected) exposure to covid-19: Secondary | ICD-10-CM

## 2019-08-28 LAB — NOVEL CORONAVIRUS, NAA: SARS-CoV-2, NAA: NOT DETECTED

## 2019-08-29 ENCOUNTER — Telehealth: Payer: Self-pay | Admitting: General Practice

## 2019-08-29 NOTE — Telephone Encounter (Signed)
Called in for covid result.   Advised of  Negative result.

## 2019-08-31 ENCOUNTER — Emergency Department: Payer: Medicare HMO

## 2019-08-31 ENCOUNTER — Other Ambulatory Visit: Payer: Self-pay

## 2019-08-31 ENCOUNTER — Inpatient Hospital Stay
Admission: EM | Admit: 2019-08-31 | Discharge: 2019-09-01 | DRG: 177 | Disposition: A | Discharge status: Other Acute Inpatient Hospital | Payer: Medicare HMO | Attending: Internal Medicine | Admitting: Internal Medicine

## 2019-08-31 DIAGNOSIS — F0390 Unspecified dementia without behavioral disturbance: Secondary | ICD-10-CM | POA: Diagnosis present

## 2019-08-31 DIAGNOSIS — J449 Chronic obstructive pulmonary disease, unspecified: Secondary | ICD-10-CM | POA: Diagnosis present

## 2019-08-31 DIAGNOSIS — I5020 Unspecified systolic (congestive) heart failure: Secondary | ICD-10-CM | POA: Diagnosis present

## 2019-08-31 DIAGNOSIS — Z823 Family history of stroke: Secondary | ICD-10-CM | POA: Diagnosis not present

## 2019-08-31 DIAGNOSIS — Z841 Family history of disorders of kidney and ureter: Secondary | ICD-10-CM | POA: Diagnosis not present

## 2019-08-31 DIAGNOSIS — J441 Chronic obstructive pulmonary disease with (acute) exacerbation: Secondary | ICD-10-CM | POA: Diagnosis present

## 2019-08-31 DIAGNOSIS — J9621 Acute and chronic respiratory failure with hypoxia: Secondary | ICD-10-CM

## 2019-08-31 DIAGNOSIS — Z8249 Family history of ischemic heart disease and other diseases of the circulatory system: Secondary | ICD-10-CM

## 2019-08-31 DIAGNOSIS — E876 Hypokalemia: Secondary | ICD-10-CM | POA: Diagnosis present

## 2019-08-31 DIAGNOSIS — E785 Hyperlipidemia, unspecified: Secondary | ICD-10-CM | POA: Diagnosis present

## 2019-08-31 DIAGNOSIS — Z66 Do not resuscitate: Secondary | ICD-10-CM | POA: Diagnosis present

## 2019-08-31 DIAGNOSIS — R4182 Altered mental status, unspecified: Secondary | ICD-10-CM | POA: Diagnosis present

## 2019-08-31 DIAGNOSIS — I2510 Atherosclerotic heart disease of native coronary artery without angina pectoris: Secondary | ICD-10-CM | POA: Diagnosis present

## 2019-08-31 DIAGNOSIS — Z7951 Long term (current) use of inhaled steroids: Secondary | ICD-10-CM | POA: Diagnosis not present

## 2019-08-31 DIAGNOSIS — R0603 Acute respiratory distress: Secondary | ICD-10-CM | POA: Diagnosis not present

## 2019-08-31 DIAGNOSIS — Z7982 Long term (current) use of aspirin: Secondary | ICD-10-CM | POA: Diagnosis not present

## 2019-08-31 DIAGNOSIS — Z951 Presence of aortocoronary bypass graft: Secondary | ICD-10-CM

## 2019-08-31 DIAGNOSIS — I110 Hypertensive heart disease with heart failure: Secondary | ICD-10-CM | POA: Diagnosis present

## 2019-08-31 DIAGNOSIS — U071 COVID-19: Secondary | ICD-10-CM | POA: Diagnosis present

## 2019-08-31 DIAGNOSIS — Z955 Presence of coronary angioplasty implant and graft: Secondary | ICD-10-CM

## 2019-08-31 LAB — URINALYSIS, COMPLETE (UACMP) WITH MICROSCOPIC
Bacteria, UA: NONE SEEN
Bilirubin Urine: NEGATIVE
Glucose, UA: NEGATIVE mg/dL
Ketones, ur: NEGATIVE mg/dL
Nitrite: NEGATIVE
Protein, ur: 30 mg/dL — AB
Specific Gravity, Urine: 1.013 (ref 1.005–1.030)
pH: 7 (ref 5.0–8.0)

## 2019-08-31 LAB — CBC WITH DIFFERENTIAL/PLATELET
Abs Immature Granulocytes: 0.08 K/uL — ABNORMAL HIGH (ref 0.00–0.07)
Basophils Absolute: 0 K/uL (ref 0.0–0.1)
Basophils Relative: 0 %
Eosinophils Absolute: 0 K/uL (ref 0.0–0.5)
Eosinophils Relative: 0 %
HCT: 42.2 % (ref 39.0–52.0)
Hemoglobin: 13.9 g/dL (ref 13.0–17.0)
Immature Granulocytes: 1 %
Lymphocytes Relative: 10 %
Lymphs Abs: 1.3 K/uL (ref 0.7–4.0)
MCH: 32.1 pg (ref 26.0–34.0)
MCHC: 32.9 g/dL (ref 30.0–36.0)
MCV: 97.5 fL (ref 80.0–100.0)
Monocytes Absolute: 0.8 K/uL (ref 0.1–1.0)
Monocytes Relative: 6 %
Neutro Abs: 11.1 K/uL — ABNORMAL HIGH (ref 1.7–7.7)
Neutrophils Relative %: 83 %
Platelets: 217 K/uL (ref 150–400)
RBC: 4.33 MIL/uL (ref 4.22–5.81)
RDW: 13.2 % (ref 11.5–15.5)
WBC: 13.3 K/uL — ABNORMAL HIGH (ref 4.0–10.5)
nRBC: 0 % (ref 0.0–0.2)

## 2019-08-31 LAB — COMPREHENSIVE METABOLIC PANEL WITH GFR
ALT: 11 U/L (ref 0–44)
AST: 16 U/L (ref 15–41)
Albumin: 3.2 g/dL — ABNORMAL LOW (ref 3.5–5.0)
Alkaline Phosphatase: 52 U/L (ref 38–126)
Anion gap: 10 (ref 5–15)
BUN: 25 mg/dL — ABNORMAL HIGH (ref 8–23)
CO2: 28 mmol/L (ref 22–32)
Calcium: 7.7 mg/dL — ABNORMAL LOW (ref 8.9–10.3)
Chloride: 104 mmol/L (ref 98–111)
Creatinine, Ser: 1.46 mg/dL — ABNORMAL HIGH (ref 0.61–1.24)
GFR calc Af Amer: 51 mL/min — ABNORMAL LOW
GFR calc non Af Amer: 44 mL/min — ABNORMAL LOW
Glucose, Bld: 94 mg/dL (ref 70–99)
Potassium: 2.8 mmol/L — ABNORMAL LOW (ref 3.5–5.1)
Sodium: 142 mmol/L (ref 135–145)
Total Bilirubin: 1.1 mg/dL (ref 0.3–1.2)
Total Protein: 6.8 g/dL (ref 6.5–8.1)

## 2019-08-31 LAB — BLOOD GAS, VENOUS
Acid-Base Excess: 5.2 mmol/L — ABNORMAL HIGH (ref 0.0–2.0)
Bicarbonate: 29.9 mmol/L — ABNORMAL HIGH (ref 20.0–28.0)
O2 Saturation: 80.1 %
Patient temperature: 37
pCO2, Ven: 43 mmHg — ABNORMAL LOW (ref 44.0–60.0)
pH, Ven: 7.45 — ABNORMAL HIGH (ref 7.250–7.430)
pO2, Ven: 42 mmHg (ref 32.0–45.0)

## 2019-08-31 LAB — POC SARS CORONAVIRUS 2 AG: SARS Coronavirus 2 Ag: POSITIVE — AB

## 2019-08-31 LAB — INFLUENZA PANEL BY PCR (TYPE A & B)
Influenza A By PCR: NEGATIVE
Influenza B By PCR: NEGATIVE

## 2019-08-31 LAB — FIBRIN DERIVATIVES D-DIMER (ARMC ONLY): Fibrin derivatives D-dimer (ARMC): 5479.9 ng{FEU}/mL — ABNORMAL HIGH (ref 0.00–499.00)

## 2019-08-31 LAB — ABO/RH: ABO/RH(D): O POS

## 2019-08-31 MED ORDER — FAMOTIDINE 20 MG PO TABS
20.0000 mg | ORAL_TABLET | Freq: Every day | ORAL | Status: DC
  Administered 2019-08-31 – 2019-09-01 (×2): 20 mg via ORAL
  Filled 2019-08-31 (×2): qty 1

## 2019-08-31 MED ORDER — HYDROCOD POLST-CPM POLST ER 10-8 MG/5ML PO SUER: 5.0000 mL | Freq: Two times a day (BID) | ORAL | Status: DC | PRN

## 2019-08-31 MED ORDER — ZINC SULFATE 220 (50 ZN) MG PO CAPS
220.0000 mg | ORAL_CAPSULE | Freq: Every day | ORAL | Status: DC
  Filled 2019-08-31: qty 1

## 2019-08-31 MED ORDER — SODIUM CHLORIDE 0.9 % IV SOLN
200.0000 mg | Freq: Once | INTRAVENOUS | Status: AC
  Administered 2019-08-31: 200 mg via INTRAVENOUS
  Filled 2019-08-31: qty 200

## 2019-08-31 MED ORDER — ADULT MULTIVITAMIN W/MINERALS CH
1.0000 | ORAL_TABLET | Freq: Every day | ORAL | Status: DC
  Administered 2019-09-01 (×2): 1 via ORAL
  Filled 2019-08-31 (×2): qty 1

## 2019-08-31 MED ORDER — GUAIFENESIN-DM 100-10 MG/5ML PO SYRP
10.0000 mL | ORAL_SOLUTION | ORAL | Status: DC | PRN
  Filled 2019-08-31: qty 10

## 2019-08-31 MED ORDER — DEXAMETHASONE SODIUM PHOSPHATE 10 MG/ML IJ SOLN
6.0000 mg | Freq: Once | INTRAMUSCULAR | Status: AC
  Administered 2019-08-31: 6 mg via INTRAVENOUS
  Filled 2019-08-31: qty 1

## 2019-08-31 MED ORDER — TIOTROPIUM BROMIDE MONOHYDRATE 18 MCG IN CAPS
18.0000 ug | ORAL_CAPSULE | Freq: Every day | RESPIRATORY_TRACT | Status: DC
  Filled 2019-08-31 (×2): qty 5

## 2019-08-31 MED ORDER — ACETAMINOPHEN 325 MG PO TABS: 650.0000 mg | ORAL_TABLET | Freq: Four times a day (QID) | ORAL | Status: DC | PRN

## 2019-08-31 MED ORDER — VANCOMYCIN HCL 1.5 G IV SOLR
1500.0000 mg | Freq: Once | INTRAVENOUS | Status: AC
  Administered 2019-08-31: 1500 mg via INTRAVENOUS
  Filled 2019-08-31: qty 1500

## 2019-08-31 MED ORDER — SODIUM CHLORIDE 0.9 % IV SOLN
100.0000 mg | Freq: Every day | INTRAVENOUS | Status: DC
  Administered 2019-09-01: 100 mg via INTRAVENOUS
  Filled 2019-08-31 (×3): qty 20

## 2019-08-31 MED ORDER — POTASSIUM CHLORIDE CRYS ER 20 MEQ PO TBCR
40.0000 meq | EXTENDED_RELEASE_TABLET | Freq: Two times a day (BID) | ORAL | Status: DC
  Administered 2019-08-31 – 2019-09-01 (×2): 40 meq via ORAL
  Filled 2019-08-31 (×2): qty 2

## 2019-08-31 MED ORDER — SODIUM CHLORIDE 0.9% FLUSH
3.0000 mL | Freq: Two times a day (BID) | INTRAVENOUS | Status: DC
  Administered 2019-08-31: 3 mL via INTRAVENOUS

## 2019-08-31 MED ORDER — IPRATROPIUM-ALBUTEROL 0.5-2.5 (3) MG/3ML IN SOLN
3.0000 mL | Freq: Once | RESPIRATORY_TRACT | Status: AC
  Administered 2019-08-31: 3 mL via RESPIRATORY_TRACT
  Filled 2019-08-31: qty 3

## 2019-08-31 MED ORDER — VITAMIN C 500 MG PO TABS
500.0000 mg | ORAL_TABLET | Freq: Every day | ORAL | Status: DC
  Filled 2019-08-31: qty 1

## 2019-08-31 MED ORDER — GABAPENTIN 100 MG PO CAPS
100.0000 mg | ORAL_CAPSULE | Freq: Two times a day (BID) | ORAL | Status: DC
  Administered 2019-09-01 (×2): 100 mg via ORAL
  Filled 2019-08-31 (×3): qty 1

## 2019-08-31 MED ORDER — IOHEXOL 350 MG/ML SOLN
60.0000 mL | Freq: Once | INTRAVENOUS | Status: AC | PRN
  Administered 2019-08-31: 60 mL via INTRAVENOUS

## 2019-08-31 MED ORDER — ENOXAPARIN SODIUM 40 MG/0.4ML ~~LOC~~ SOLN
40.0000 mg | SUBCUTANEOUS | Status: DC
  Administered 2019-08-31: 40 mg via SUBCUTANEOUS
  Filled 2019-08-31 (×2): qty 0.4

## 2019-08-31 MED ORDER — DEXAMETHASONE SODIUM PHOSPHATE 10 MG/ML IJ SOLN
6.0000 mg | INTRAMUSCULAR | Status: DC
  Administered 2019-08-31: 6 mg via INTRAVENOUS
  Filled 2019-08-31: qty 1

## 2019-08-31 MED ORDER — ASPIRIN 81 MG PO TBEC
81.0000 mg | DELAYED_RELEASE_TABLET | Freq: Every day | ORAL | Status: DC
  Administered 2019-09-01 (×2): 81 mg via ORAL
  Filled 2019-08-31 (×2): qty 1

## 2019-08-31 MED ORDER — SODIUM CHLORIDE 0.9 % IV BOLUS
1000.0000 mL | Freq: Once | INTRAVENOUS | Status: AC
  Administered 2019-08-31: 1000 mL via INTRAVENOUS

## 2019-08-31 MED ORDER — SIMVASTATIN 20 MG PO TABS
20.0000 mg | ORAL_TABLET | Freq: Every day | ORAL | Status: DC
  Administered 2019-08-31: 20 mg via ORAL
  Filled 2019-08-31: qty 2
  Filled 2019-08-31: qty 1

## 2019-08-31 MED ORDER — ALBUTEROL SULFATE HFA 108 (90 BASE) MCG/ACT IN AERS
2.0000 | INHALATION_SPRAY | RESPIRATORY_TRACT | Status: DC
  Administered 2019-09-01 (×4): 2 via RESPIRATORY_TRACT
  Filled 2019-08-31: qty 6.7

## 2019-08-31 MED ORDER — ESCITALOPRAM OXALATE 10 MG PO TABS
10.0000 mg | ORAL_TABLET | Freq: Every day | ORAL | Status: DC
  Administered 2019-08-31: 10 mg via ORAL
  Filled 2019-08-31 (×2): qty 1

## 2019-08-31 MED ORDER — SODIUM CHLORIDE 0.9 % IV SOLN
2.0000 g | Freq: Once | INTRAVENOUS | Status: AC
  Administered 2019-08-31: 2 g via INTRAVENOUS
  Filled 2019-08-31: qty 2

## 2019-08-31 NOTE — H&P (Signed)
History and Physical    Alan Ortega [id number] DOB: 1934/02/20 DOA: 08/31/2019  PCP: [name], MD  Patient coming from: Home   Chief Complaint: sob, fever  HPI: Alan Ortega is a 85 y.o. male with medical history significant of dementia, CAD,  COPD, hypertension and hyperlipidemia presented with fever and shortness of breath.  Please note patient has dementia and is a poor historian and history was given by his daughter via phone.  Per daughter patient had double exposure to Covid the first exposure was November 28 the second exposure with December 4 by his home health care.  He was tested by drive-through at [facility] on Friday which was tested negative.  He started having signs of Covid and per daughter's doctor ordered Z-Pak and steroid.  Today he started having fever and wheezing and daughter called EMS. Patient denies any chest pain, dizziness, lightheadedness, or any other symptoms.  He does state he is feeling a little bit better when he has the virus.  ED Course: In the ED he was tested for Covid which was positive.  He had a CT of the chest please see full report but did not reveal any pulmonary embolism.  He was started on cefepime, Solu-Medrol, and vancomycin for presumed COPD exacerbation.  I did speak to ER to start patient on [place] severe and Decadron as he is Covid positive. He is requiring 4L 02NC  Review of Systems: All systems reviewed and otherwise negative.    Past Medical History:  Diagnosis Date  . CAD (coronary artery disease)   . Cancer [facility])    Prostate  . COPD (chronic obstructive pulmonary disease) ([place])   . HLD (hyperlipidemia)   . Hypertension     Past Surgical History:  Procedure Laterality Date  . CHOLECYSTECTOMY    . COLONOSCOPY    . CORONARY ANGIOPLASTY    . CORONARY ARTERY BYPASS GRAFT    . EXCISION MASS LOWER EXTREMETIES Right 09/09/2018   Procedure: EXCISION MASS LOWER EXTREMETIES;  Surgeon: Benjamine Sprague, DO;   Location: [facility] ORS;  Service: General;  Laterality: Right;  . TONSILLECTOMY       reports that he has never smoked. He has never used smokeless tobacco. He reports that he does not drink alcohol or use drugs.  No Known Allergies  Family History  Problem Relation Age of Onset  . Heart disease Mother   . Stroke Father   . Kidney disease Brother      Prior to Admission medications   Medication Sig Start Date End Date Taking? Authorizing Provider  aspirin EC 81 MG tablet Take 81 mg by mouth at bedtime.    Yes [provider]  escitalopram (LEXAPRO) 10 MG tablet Take 10 mg by mouth at bedtime.    Yes [provider]  furosemide (LASIX) 40 MG tablet Take 60 mg by mouth daily. 08/14/19  Yes [provider]  gabapentin (NEURONTIN) 100 MG capsule Take 100 mg by mouth 2 (two) times daily. 07/21/19  Yes [provider]  metoprolol succinate (TOPROL-XL) 25 MG 24 hr tablet Take 25 mg by mouth daily. 08/14/19  Yes [provider]  predniSONE (DELTASONE) 20 MG tablet Take 80 mg by mouth daily. 08/27/19  Yes [provider]  simvastatin (ZOCOR) 20 MG tablet Take 20 mg by mouth at bedtime.    Yes [provider]  albuterol (VENTOLIN HFA) 108 (90 Base) MCG/ACT inhaler Inhale 2 puffs into the lungs every 6 (six) hours as  needed. 05/14/19   [provider]  budesonide (PULMICORT) 0.25 MG/2ML nebulizer solution Take 2 mLs (0.25 mg total) by nebulization 2 (two) times daily. 10/10/18   Dustin Flock, MD  carvedilol (COREG) 3.125 MG tablet Take 1 tablet (3.125 mg total) by mouth 2 (two) times daily with a meal. Patient not taking: Reported on 08/31/2019 10/10/18   Dustin Flock, MD  ipratropium-albuterol (DUONEB) 0.5-2.5 (3) MG/3ML SOLN Take 3 mLs by nebulization every 6 (six) hours. 10/10/18   Dustin Flock, MD  nystatin (MYCOSTATIN/NYSTOP) powder Apply topically. 09/27/18 09/27/19  [provider]  tiotropium (SPIRIVA HANDIHALER)  18 MCG inhalation capsule Place 1 capsule (18 mcg total) into inhaler and inhale daily. Patient not taking: Reported on 08/31/2019 10/10/18 10/10/19  Dustin Flock, MD    Physical Exam: Vitals:   08/31/19 1325 08/31/19 1430 08/31/19 1615 08/31/19 1700  BP: 109/70 109/87 112/61 (!) 104/57  Pulse: 87 (!) 51 92 62  Resp: (!) 28 (!) 23 19 20   Temp: 99.9 F (37.7 C)     TempSrc: Oral     SpO2: 95% 96% 96% 96%  Weight:      Height:        Constitutional: NAD, calm, comfortable Vitals:   08/31/19 1325 08/31/19 1430 08/31/19 1615 08/31/19 1700  BP: 109/70 109/87 112/61 (!) 104/57  Pulse: 87 (!) 51 92 62  Resp: (!) 28 (!) 23 19 20   Temp: 99.9 F (37.7 C)     TempSrc: Oral     SpO2: 95% 96% 96% 96%  Weight:      Height:      GEN: pleasant, NAD Eyes: EOMI, conjunctiva normal ENMT: Mask on  neck: normal, supple, no masses, no thyromegaly Respiratory: clear to auscultation bilaterally, minimal wheezing no crackles. Normal respiratory effort. No accessory muscle use.  Cardiovascular: Regular rate and rhythm, no murmurs / rubs / gallops.  Abdomen: no tenderness, no masses palpated.  No guarding or rebound bowel sounds positive.  Musculoskeletal: no clubbing / cyanosis.  No edema Skin: Warm dry Neurologic: CN 2-12 grossly intact.  Awake and alert and 3 Psychiatric: Normal judgment and insight and normal mood for current setting   Labs on Admission: I have personally reviewed following labs and imaging studies  CBC: Recent Labs  Lab 08/31/19 1344  WBC 13.3*  NEUTROABS 11.1*  HGB 13.9  HCT 42.2  MCV 97.5  PLT [id number]   Basic Metabolic Panel: Recent Labs  Lab 08/31/19 1344  NA 142  K 2.8*  CL 104  CO2 28  GLUCOSE 94  BUN 25*  CREATININE 1.46*  CALCIUM 7.7*   GFR: Estimated Creatinine Clearance: 35.2 mL/min (A) (by C-G formula based on SCr of 1.46 mg/dL (H)). Liver Function Tests: Recent Labs  Lab 08/31/19 1344  AST 16  ALT 11  ALKPHOS 52  BILITOT 1.1  PROT 6.8   ALBUMIN 3.2*   No results for input(s): LIPASE, AMYLASE in the last 168 hours. No results for input(s): AMMONIA in the last 168 hours. Coagulation Profile: No results for input(s): INR, PROTIME in the last 168 hours. Cardiac Enzymes: No results for input(s): CKTOTAL, CKMB, CKMBINDEX, TROPONINI in the last 168 hours. BNP (last 3 results) No results for input(s): PROBNP in the last 8760 hours. HbA1C: No results for input(s): HGBA1C in the last 72 hours. CBG: No results for input(s): GLUCAP in the last 168 hours. Lipid Profile: No results for input(s): CHOL, HDL, LDLCALC, TRIG, CHOLHDL, LDLDIRECT in the last 72 hours. Thyroid Function Tests:  No results for input(s): TSH, T4TOTAL, FREET4, T3FREE, THYROIDAB in the last 72 hours. Anemia Panel: No results for input(s): VITAMINB12, FOLATE, FERRITIN, TIBC, IRON, RETICCTPCT in the last 72 hours. Urine analysis:    Component Value Date/Time   COLORURINE YELLOW (A) 08/31/2019 1544   APPEARANCEUR CLEAR (A) 08/31/2019 1544   APPEARANCEUR Clear 10/15/2018 1135   LABSPEC 1.013 08/31/2019 1544   LABSPEC 1.013 03/04/2012 0025   PHURINE 7.0 08/31/2019 1544   GLUCOSEU NEGATIVE 08/31/2019 1544   GLUCOSEU 50 mg/dL 03/04/2012 0025   HGBUR SMALL (A) 08/31/2019 1544   BILIRUBINUR NEGATIVE 08/31/2019 1544   BILIRUBINUR Negative 10/15/2018 1135   BILIRUBINUR Negative 03/04/2012 0025   KETONESUR NEGATIVE 08/31/2019 1544   PROTEINUR 30 (A) 08/31/2019 1544   NITRITE NEGATIVE 08/31/2019 1544   LEUKOCYTESUR LARGE (A) 08/31/2019 1544   LEUKOCYTESUR Trace 03/04/2012 0025    Radiological Exams on Admission: Ct Angio Chest Pe W And/or Wo Contrast  Result Date: 08/31/2019 CLINICAL DATA:  Hypoxia on home oxygen. More confused and normal. Recent COVID-19 exposure. Elevated D-dimer. Evaluate for pulmonary embolism. EXAM: CT ANGIOGRAPHY CHEST WITH CONTRAST TECHNIQUE: Multidetector CT imaging of the chest was performed using the standard protocol during bolus  administration of intravenous contrast. Multiplanar CT image reconstructions and MIPs were obtained to evaluate the vascular anatomy. CONTRAST:  63mL OMNIPAQUE IOHEXOL 350 MG/ML SOLN COMPARISON:  09/13/2013 FINDINGS: Cardiovascular: Heart is normal in size. Calcified plaque is present over the left main and 3 vessel coronary arteries. There is mild calcified plaque over the thoracic aorta. Thoracic aorta is normal caliber without dissection or aneurysm. Pulmonary arterial system is well opacified without evidence of emboli. Remaining vascular structures are unremarkable. Mediastinum/Nodes: 0.1 cm AP window lymph node reactive. No other significant mediastinal or hilar adenopathy. Remaining mediastinal structures are normal. Lungs/Pleura: Lungs are adequately inflated with moderate centrilobular emphysematous disease. There is mild posterior dependent bibasilar atelectasis left worse than right. No evidence of effusion. No lobar consolidation. Airways are unremarkable. Upper Abdomen: Calcified plaque over the abdominal aorta. There are a few small hypodensities over the liver unchanged likely cysts. Subtle cystic change over the upper pole left kidney unchanged. Stable 1.6 cm left adrenal nodule likely adenoma. Musculoskeletal: Degenerative change of the spine. Review of the MIP images confirms the above findings. IMPRESSION: 1. No evidence of pulmonary emboli. No acute cardiopulmonary disease. Minimal dependent bibasilar atelectasis. 2. Aortic Atherosclerosis (ICD10-I70.0) and Emphysema (ICD10-J43.9). Atherosclerotic coronary artery disease. 3. Few small liver hypodensities unchanged likely cysts. Minimal cystic change over the upper pole left kidney unchanged. 1.6 cm left adrenal nodule unchanged likely an adenoma. Electronically Signed   By: Marin Olp M.D.   On: 08/31/2019 16:24   Dg Chest Portable 1 View  Result Date: 08/31/2019 CLINICAL DATA:  Shortness of breath, altered mental status, hypoxia EXAM:  PORTABLE CHEST 1 VIEW COMPARISON:  Portable exam 1346 hours compared to 10/08/2018 FINDINGS: Upper normal size of cardiac silhouette post CABG. Atherosclerotic calcification aorta. Mediastinal contours and pulmonary vascularity otherwise normal. Emphysematous and minimal bronchitic consistent with COPD. Bibasilar interstitial infiltrates, chronic unchanged since 10/08/2018. Upper lungs clear. No pleural effusion or pneumothorax. IMPRESSION: Borderline enlargement of cardiac silhouette post CABG. COPD changes with bibasilar interstitial infiltrates; these could represent chronic interstitial disease or recurrent bibasilar pneumonia. Electronically Signed   By: Lavonia Dana M.D.   On: 08/31/2019 14:15    EKG: Independently reviewed.  Sinus rhythm with PVCs, right bundle branch block no acute ST changes  Assessment/Plan Active Problems:  Respiratory distress, acute   1.Acute Respiratory Distress- with leukocytosis and fever 2/2 COVID 19 positive with likely exacerbating copd. Requiring 4 L nasal cannula Will start Covid admission protocol, starting on Decadron 6 mg IV daily Start Remdesivir 100 mg IV daily Continue his home inhalers Avoid duo nebs and other nebs Hold his home prednisone p.o. DC IV antibiotics F/u bcx from ED.  2. CAD- on statin, beta blk, and asa at home  Hold beta blk for now monitor bp and #1 since wheezing  3. Hypokalemia- Replace with po, monitor levels Tele  4. GI PPX- Famotidine     DVT prophylaxis: Lovenox Code Status: DNR/DNI Family Communication: Spoke to patient's daughter about patient's current status.  I did recommend for her to get Covid testing to.  Meanwhile quarantine herself.  We discussed patient's CODE STATUS and she would like him to be DNR/DNI.   Disposition Plan: likely be inpatient as he requires more than 2 midnight stays Consults called: None Admission status: Inpatient   Nolberto Hanlon MD [facility] Pager [phone]   If 7PM-7AM,  please contact night-coverage [email or web address] Password [email or web address]  08/31/2019, 5:55 PM

## 2019-08-31 NOTE — ED Notes (Signed)
Patient off unit to CT. Family at bedside and updated on POC.

## 2019-08-31 NOTE — ED Triage Notes (Signed)
BIBA for AMS and hypoxia. 84% on home O2 2L [place]. Febrile to 101F. H/o CHF COPD on home O2, more confused than is typical. Recent exposure to covid on 11/28, tested 12/44 approx, resulted 12/7 negative. DNR

## 2019-08-31 NOTE — ED Notes (Signed)
pCXR at bedside

## 2019-08-31 NOTE — ED Notes (Signed)
Daughter reports that if patient requires urinary catheter, it must be placed by urologist 2/2 significant prostate scar tissue.

## 2019-08-31 NOTE — ED Provider Notes (Signed)
[facility] Emergency Department Provider Note  ____________________________________________   First MD Initiated Contact with Patient 08/31/19 1323     (approximate)  I have reviewed the triage vital signs and the nursing notes.   HISTORY  Chief Complaint Altered Mental Status and Shortness of Breath    HPI Alan Ortega is a 85 y.o. male  With h/o COPD, HTN here with SOB, confusion. History provided primarily by daughter. Per report, he lives @ home. Two caregivers were recently diagnosed with COVID. Over the past week, he's had progressively worsening cough. Over the past 2 days, he's now developed fevers, confusion, and tachypnea/increased WOB.  On my assessment, pt denies any complaints, though he is confused and has difficulty remembering why he is here.  Level 5 caveat invoked as remainder of history, ROS, and physical exam limited due to patient's AMS/dementia.         Past Medical History:  Diagnosis Date   CAD (coronary artery disease)    Cancer ([place])    Prostate   COPD (chronic obstructive pulmonary disease) ([place])    HLD (hyperlipidemia)    Hypertension     Patient Active Problem List   Diagnosis Date Noted   Gross hematuria    History of prostate cancer    Kidney stones    Sepsis ([place]) 10/03/2018   CAP (community acquired pneumonia) 10/03/2018   HTN (hypertension) 10/03/2018   HLD (hyperlipidemia) 10/03/2018   COPD with acute exacerbation ([place]) 10/03/2018   CAD (coronary artery disease) 10/03/2018   Acute respiratory failure with hypoxia ([place]) 10/03/2018    Past Surgical History:  Procedure Laterality Date   CHOLECYSTECTOMY     COLONOSCOPY     CORONARY ANGIOPLASTY     CORONARY ARTERY BYPASS GRAFT     EXCISION MASS LOWER EXTREMETIES Right 09/09/2018   Procedure: EXCISION MASS LOWER EXTREMETIES;  Surgeon: Benjamine Sprague, DO;  Location: [facility] ORS;  Service: General;  Laterality: Right;   TONSILLECTOMY       Prior to Admission medications   Medication Sig Start Date End Date Taking? Authorizing Provider  ALPRAZolam Duanne Moron) 0.5 MG tablet Take 1 tablet (0.5 mg total) by mouth 3 (three) times daily as needed for anxiety. 10/10/18   Dustin Flock, MD  aspirin EC 81 MG tablet Take 81 mg by mouth at bedtime.     [provider]  budesonide (PULMICORT) 0.25 MG/2ML nebulizer solution Take 2 mLs (0.25 mg total) by nebulization 2 (two) times daily. 10/10/18   Dustin Flock, MD  carvedilol (COREG) 3.125 MG tablet Take 1 tablet (3.125 mg total) by mouth 2 (two) times daily with a meal. 10/10/18   Dustin Flock, MD  escitalopram (LEXAPRO) 10 MG tablet Take 10 mg by mouth at bedtime.     [provider]  guaiFENesin-dextromethorphan (ROBITUSSIN DM) 100-10 MG/5ML syrup Take 5 mLs by mouth every 4 (four) hours as needed for cough. 10/10/18   Dustin Flock, MD  ipratropium-albuterol (DUONEB) 0.5-2.5 (3) MG/3ML SOLN Take 3 mLs by nebulization every 6 (six) hours. 10/10/18   Dustin Flock, MD  nystatin (MYCOSTATIN/NYSTOP) powder Apply topically. 09/27/18 09/27/19  [provider]  predniSONE (STERAPRED UNI-PAK 21 TAB) 10 MG (21) TBPK tablet Start at 60mg  taper by 10mg  until complete 10/10/18   Dustin Flock, MD  simvastatin (ZOCOR) 20 MG tablet Take 20 mg by mouth at bedtime.     [provider]  tiotropium (SPIRIVA HANDIHALER) 18 MCG inhalation capsule Place 1 capsule (18 mcg total) into  inhaler and inhale daily. 10/10/18 10/10/19  Dustin Flock, MD    Allergies Patient has no known allergies.  Family History  Problem Relation Age of Onset   Heart disease Mother    Stroke Father    Kidney disease Brother     Social History Social History   Tobacco Use   Smoking status: Never Smoker   Smokeless tobacco: Never Used  Substance Use Topics   Alcohol use: Never    Frequency: Never   Drug use: Never    Review of Systems  Review of Systems  Unable to  perform ROS: Dementia  Constitutional: Positive for fatigue.  Respiratory: Positive for shortness of breath.      ____________________________________________  PHYSICAL EXAM:      VITAL SIGNS: ED Triage Vitals  Enc Vitals Group     BP 08/31/19 1325 109/70     Pulse Rate 08/31/19 1325 87     Resp 08/31/19 1325 (!) 28     Temp 08/31/19 1325 99.9 F (37.7 C)     Temp Source 08/31/19 1325 Oral     SpO2 08/31/19 1322 (!) 84 %     Weight 08/31/19 1323 143 lb 4.8 oz (65 kg)     Height 08/31/19 1323 5\' 10"  (1.778 m)     Head Circumference --      Peak Flow --      Pain Score --      Pain Loc --      Pain Edu? --      Excl. in [place]? --      Physical Exam Vitals signs and nursing note reviewed.  Constitutional:      General: He is in acute distress.     Appearance: He is well-developed.  HENT:     Head: Normocephalic and atraumatic.     Comments: Dry MM Eyes:     Conjunctiva/sclera: Conjunctivae normal.  Neck:     Musculoskeletal: Neck supple.  Cardiovascular:     Rate and Rhythm: Regular rhythm.     Heart sounds: Normal heart sounds.  Pulmonary:     Effort: Pulmonary effort is normal. No respiratory distress.     Breath sounds: Examination of the right-upper field reveals wheezing. Examination of the left-upper field reveals wheezing. Examination of the right-middle field reveals wheezing. Examination of the left-middle field reveals wheezing. Examination of the right-lower field reveals wheezing and rales. Examination of the left-lower field reveals wheezing and rales. Decreased breath sounds, wheezing and rales present.  Abdominal:     General: There is no distension.  Skin:    General: Skin is warm.     Capillary Refill: Capillary refill takes less than 2 seconds.     Findings: No rash.  Neurological:     Mental Status: He is alert and oriented to person, place, and time.     Motor: No abnormal muscle tone.       ____________________________________________     LABS (all labs ordered are listed, but only abnormal results are displayed)  Labs Reviewed  CBC WITH DIFFERENTIAL/PLATELET - Abnormal; Notable for the following components:      Result Value   WBC 13.3 (*)    Neutro Abs 11.1 (*)    Abs Immature Granulocytes 0.08 (*)    All other components within normal limits  COMPREHENSIVE METABOLIC PANEL - Abnormal; Notable for the following components:   Potassium 2.8 (*)    BUN 25 (*)    Creatinine, Ser 1.46 (*)  Calcium 7.7 (*)    Albumin 3.2 (*)    GFR calc non Af Amer 44 (*)    GFR calc Af Amer 51 (*)    All other components within normal limits  BLOOD GAS, VENOUS - Abnormal; Notable for the following components:   pH, Ven 7.45 (*)    pCO2, Ven 43 (*)    Bicarbonate 29.9 (*)    Acid-Base Excess 5.2 (*)    All other components within normal limits  FIBRIN DERIVATIVES D-DIMER ([facility] ONLY) - Abnormal; Notable for the following components:   Fibrin derivatives D-dimer ([facility]) 5,479.90 (*)    All other components within normal limits  URINALYSIS, COMPLETE (UACMP) WITH MICROSCOPIC - Abnormal; Notable for the following components:   Color, Urine YELLOW (*)    APPearance CLEAR (*)    Hgb urine dipstick SMALL (*)    Protein, ur 30 (*)    Leukocytes,Ua LARGE (*)    All other components within normal limits  POC SARS CORONAVIRUS 2 AG - Abnormal; Notable for the following components:   SARS Coronavirus 2 Ag POSITIVE (*)    All other components within normal limits  CULTURE, BLOOD (ROUTINE X 2)  CULTURE, BLOOD (ROUTINE X 2)  SARS CORONAVIRUS 2 (TAT 6-24 HRS)  C-REACTIVE PROTEIN  POC SARS CORONAVIRUS 2 AG -  ED    ____________________________________________  EKG: Normal sinus rhythm, vR 98. QRS 134, QTc 521. PVCs, no acute ST elevations or depressions.   EKG Interpretation  Date/Time:  Wednesday August 31 2019 13:24:09 EST Ventricular Rate:  98 PR Interval:    QRS Duration: 134 QT Interval:  408 QTC Calculation: 521 R  Axis:   121 Text Interpretation: Sinus arrhythmia Ventricular premature complex RBBB and LPFB Confirmed by UNCONFIRMED, DOCTOR ([id number]), editor Mel Almond, Tammy [phone]) on 08/31/2019 2:38:21 PM       ________________________________________  RADIOLOGY All imaging, including plain films, CT scans, and ultrasounds, independently reviewed by me, and interpretations confirmed via formal radiology reads.  ED MD interpretation:   CXR: COPD with bibasilar PNA CT Angio: Pending  Official radiology report(s): Ct Angio Chest Pe W And/or Wo Contrast  Result Date: 08/31/2019 CLINICAL DATA:  Hypoxia on home oxygen. More confused and normal. Recent COVID-19 exposure. Elevated D-dimer. Evaluate for pulmonary embolism. EXAM: CT ANGIOGRAPHY CHEST WITH CONTRAST TECHNIQUE: Multidetector CT imaging of the chest was performed using the standard protocol during bolus administration of intravenous contrast. Multiplanar CT image reconstructions and MIPs were obtained to evaluate the vascular anatomy. CONTRAST:  66mL OMNIPAQUE IOHEXOL 350 MG/ML SOLN COMPARISON:  09/13/2013 FINDINGS: Cardiovascular: Heart is normal in size. Calcified plaque is present over the left main and 3 vessel coronary arteries. There is mild calcified plaque over the thoracic aorta. Thoracic aorta is normal caliber without dissection or aneurysm. Pulmonary arterial system is well opacified without evidence of emboli. Remaining vascular structures are unremarkable. Mediastinum/Nodes: 0.1 cm AP window lymph node reactive. No other significant mediastinal or hilar adenopathy. Remaining mediastinal structures are normal. Lungs/Pleura: Lungs are adequately inflated with moderate centrilobular emphysematous disease. There is mild posterior dependent bibasilar atelectasis left worse than right. No evidence of effusion. No lobar consolidation. Airways are unremarkable. Upper Abdomen: Calcified plaque over the abdominal aorta. There are a few small hypodensities  over the liver unchanged likely cysts. Subtle cystic change over the upper pole left kidney unchanged. Stable 1.6 cm left adrenal nodule likely adenoma. Musculoskeletal: Degenerative change of the spine. Review of the MIP images confirms the above findings. IMPRESSION: 1.  No evidence of pulmonary emboli. No acute cardiopulmonary disease. Minimal dependent bibasilar atelectasis. 2. Aortic Atherosclerosis (ICD10-I70.0) and Emphysema (ICD10-J43.9). Atherosclerotic coronary artery disease. 3. Few small liver hypodensities unchanged likely cysts. Minimal cystic change over the upper pole left kidney unchanged. 1.6 cm left adrenal nodule unchanged likely an adenoma. Electronically Signed   By: Marin Olp M.D.   On: 08/31/2019 16:24   Dg Chest Portable 1 View  Result Date: 08/31/2019 CLINICAL DATA:  Shortness of breath, altered mental status, hypoxia EXAM: PORTABLE CHEST 1 VIEW COMPARISON:  Portable exam 1346 hours compared to 10/08/2018 FINDINGS: Upper normal size of cardiac silhouette post CABG. Atherosclerotic calcification aorta. Mediastinal contours and pulmonary vascularity otherwise normal. Emphysematous and minimal bronchitic consistent with COPD. Bibasilar interstitial infiltrates, chronic unchanged since 10/08/2018. Upper lungs clear. No pleural effusion or pneumothorax. IMPRESSION: Borderline enlargement of cardiac silhouette post CABG. COPD changes with bibasilar interstitial infiltrates; these could represent chronic interstitial disease or recurrent bibasilar pneumonia. Electronically Signed   By: Lavonia Dana M.D.   On: 08/31/2019 14:15    ____________________________________________  PROCEDURES   Procedure(s) performed (including Critical Care):  .Critical Care Performed by: Duffy Bruce, MD Authorized by: Duffy Bruce, MD   Critical care provider statement:    Critical care time (minutes):  35   Critical care time was exclusive of:  Separately billable procedures and treating  other patients and teaching time   Critical care was necessary to treat or prevent imminent or life-threatening deterioration of the following conditions:  Cardiac failure, circulatory failure and respiratory failure   Critical care was time spent personally by me on the following activities:  Development of treatment plan with patient or surrogate, discussions with consultants, evaluation of patient's response to treatment, examination of patient, obtaining history from patient or surrogate, ordering and performing treatments and interventions, ordering and review of laboratory studies, ordering and review of radiographic studies, pulse oximetry, re-evaluation of patient's condition and review of old charts   I assumed direction of critical care for this patient from another provider in my specialty: no      ____________________________________________  INITIAL IMPRESSION / MDM / [place] / ED COURSE  As part of my medical decision making, I reviewed the following data within the [place] notes reviewed and incorporated, Old chart reviewed, Notes from prior ED visits, and [place] Controlled Substance Database       *Alan Ortega was evaluated in Emergency Department on 08/31/2019 for the symptoms described in the history of present illness. He was evaluated in the context of the global COVID-19 pandemic, which necessitated consideration that the patient might be at risk for infection with the SARS-CoV-2 virus that causes COVID-19. Institutional protocols and algorithms that pertain to the evaluation of patients at risk for COVID-19 are in a state of rapid change based on information released by regulatory bodies including the CDC and federal and state organizations. These policies and algorithms were followed during the patient's care in the ED.  Some ED evaluations and interventions may be delayed as a result of limited staffing during the pandemic.*  Clinical  Course as of Aug 30 1702  Wed Aug 31, 2019  1527 85 yo M here with AMS, hypoxia, fever. Suspect HCAP vs COVID-19, less likely PE. Pt started on broad spectrum ABX. IVF started. Labs show leukocytosis, ilkely mild dehydration. K replaced. D-Dimer elevated so CT Angio pending. Plan to admit.   [CI]    Clinical Course User Index [CI]  Duffy Bruce, MD    Medical Decision Making:  As above. Steroids, nebs, and broad spectrum ABX given. Plan to admit for acute on chronic hypoxic resp failure, likely 2/2 COVID.  ____________________________________________  FINAL CLINICAL IMPRESSION(S) / ED DIAGNOSES  Final diagnoses:  Acute on chronic respiratory failure with hypoxia ([place])  COVID-19 virus infection     MEDICATIONS GIVEN DURING THIS VISIT:  Medications  vancomycin (VANCOCIN) 1,500 mg in sodium chloride 0.9 % 500 mL IVPB (1,500 mg Intravenous New Bag/Given 08/31/19 1619)  dexamethasone (DECADRON) injection 6 mg (6 mg Intravenous Given 08/31/19 1413)  ipratropium-albuterol (DUONEB) 0.5-2.5 (3) MG/3ML nebulizer solution 3 mL (3 mLs Nebulization Given 08/31/19 1424)  sodium chloride 0.9 % bolus 1,000 mL (1,000 mLs Intravenous New Bag/Given 08/31/19 1504)  ceFEPIme (MAXIPIME) 2 g in sodium chloride 0.9 % 100 mL IVPB (2 g Intravenous New Bag/Given 08/31/19 1518)  iohexol (OMNIPAQUE) 350 MG/ML injection 60 mL (60 mLs Intravenous Contrast Given 08/31/19 1547)     ED Discharge Orders    None       Note:  This document was prepared using Dragon voice recognition software and may include unintentional dictation errors.   Duffy Bruce, MD 08/31/19 [phone]

## 2019-08-31 NOTE — Progress Notes (Signed)
PHARMACY -  BRIEF ANTIBIOTIC NOTE   Pharmacy has received consult(s) for cefepime and vanc from an ED provider.  The patient's profile has been reviewed for ht/wt/allergies/indication/available labs.    One time order(s) placed for cefepime 2 g + vanc 1500 mg  Further antibiotics/pharmacy consults should be ordered by admitting physician if indicated.                       Thank you,  Tawnya Crook, PharmD 08/31/2019  3:04 PM

## 2019-08-31 NOTE — ED Notes (Addendum)
Pt c/o diff. Breathing. MD notified. Inhaler requested from pharmacy. Pt O2 sat 97%2LNC. Pt appears anxious but able to speak in complete sentences.

## 2019-09-01 ENCOUNTER — Inpatient Hospital Stay (HOSPITAL_COMMUNITY)
Admission: AD | Admit: 2019-09-01 | Discharge: 2019-09-05 | DRG: 177 | Disposition: A | Discharge status: Hospice | Payer: Medicare HMO | Source: Other Acute Inpatient Hospital | Attending: Internal Medicine | Admitting: Internal Medicine

## 2019-09-01 DIAGNOSIS — J9621 Acute and chronic respiratory failure with hypoxia: Secondary | ICD-10-CM | POA: Diagnosis present

## 2019-09-01 DIAGNOSIS — J440 Chronic obstructive pulmonary disease with acute lower respiratory infection: Secondary | ICD-10-CM | POA: Diagnosis present

## 2019-09-01 DIAGNOSIS — I130 Hypertensive heart and chronic kidney disease with heart failure and stage 1 through stage 4 chronic kidney disease, or unspecified chronic kidney disease: Secondary | ICD-10-CM | POA: Diagnosis present

## 2019-09-01 DIAGNOSIS — Z66 Do not resuscitate: Secondary | ICD-10-CM | POA: Diagnosis present

## 2019-09-01 DIAGNOSIS — Z841 Family history of disorders of kidney and ureter: Secondary | ICD-10-CM

## 2019-09-01 DIAGNOSIS — Z515 Encounter for palliative care: Secondary | ICD-10-CM | POA: Diagnosis not present

## 2019-09-01 DIAGNOSIS — G9341 Metabolic encephalopathy: Secondary | ICD-10-CM | POA: Diagnosis present

## 2019-09-01 DIAGNOSIS — I2510 Atherosclerotic heart disease of native coronary artery without angina pectoris: Secondary | ICD-10-CM | POA: Diagnosis present

## 2019-09-01 DIAGNOSIS — U071 COVID-19: Secondary | ICD-10-CM | POA: Diagnosis present

## 2019-09-01 DIAGNOSIS — J449 Chronic obstructive pulmonary disease, unspecified: Secondary | ICD-10-CM

## 2019-09-01 DIAGNOSIS — Y92239 Unspecified place in hospital as the place of occurrence of the external cause: Secondary | ICD-10-CM | POA: Diagnosis not present

## 2019-09-01 DIAGNOSIS — Z8249 Family history of ischemic heart disease and other diseases of the circulatory system: Secondary | ICD-10-CM | POA: Diagnosis not present

## 2019-09-01 DIAGNOSIS — W19XXXA Unspecified fall, initial encounter: Secondary | ICD-10-CM | POA: Diagnosis not present

## 2019-09-01 DIAGNOSIS — Z7902 Long term (current) use of antithrombotics/antiplatelets: Secondary | ICD-10-CM

## 2019-09-01 DIAGNOSIS — J1289 Other viral pneumonia: Secondary | ICD-10-CM | POA: Diagnosis present

## 2019-09-01 DIAGNOSIS — E785 Hyperlipidemia, unspecified: Secondary | ICD-10-CM | POA: Diagnosis present

## 2019-09-01 DIAGNOSIS — Z951 Presence of aortocoronary bypass graft: Secondary | ICD-10-CM | POA: Diagnosis not present

## 2019-09-01 DIAGNOSIS — J9601 Acute respiratory failure with hypoxia: Secondary | ICD-10-CM | POA: Diagnosis present

## 2019-09-01 DIAGNOSIS — R410 Disorientation, unspecified: Secondary | ICD-10-CM | POA: Diagnosis present

## 2019-09-01 DIAGNOSIS — S065X9A Traumatic subdural hemorrhage with loss of consciousness of unspecified duration, initial encounter: Secondary | ICD-10-CM | POA: Diagnosis not present

## 2019-09-01 DIAGNOSIS — Z8546 Personal history of malignant neoplasm of prostate: Secondary | ICD-10-CM | POA: Diagnosis not present

## 2019-09-01 DIAGNOSIS — J1282 Pneumonia due to coronavirus disease 2019: Secondary | ICD-10-CM | POA: Diagnosis present

## 2019-09-01 DIAGNOSIS — I629 Nontraumatic intracranial hemorrhage, unspecified: Secondary | ICD-10-CM | POA: Diagnosis not present

## 2019-09-01 DIAGNOSIS — I5022 Chronic systolic (congestive) heart failure: Secondary | ICD-10-CM | POA: Diagnosis present

## 2019-09-01 DIAGNOSIS — N390 Urinary tract infection, site not specified: Secondary | ICD-10-CM | POA: Diagnosis present

## 2019-09-01 DIAGNOSIS — F0390 Unspecified dementia without behavioral disturbance: Secondary | ICD-10-CM | POA: Diagnosis present

## 2019-09-01 DIAGNOSIS — Z79899 Other long term (current) drug therapy: Secondary | ICD-10-CM

## 2019-09-01 DIAGNOSIS — Z9861 Coronary angioplasty status: Secondary | ICD-10-CM

## 2019-09-01 DIAGNOSIS — E876 Hypokalemia: Secondary | ICD-10-CM | POA: Diagnosis present

## 2019-09-01 DIAGNOSIS — Z823 Family history of stroke: Secondary | ICD-10-CM | POA: Diagnosis not present

## 2019-09-01 DIAGNOSIS — Z7951 Long term (current) use of inhaled steroids: Secondary | ICD-10-CM

## 2019-09-01 DIAGNOSIS — N189 Chronic kidney disease, unspecified: Secondary | ICD-10-CM | POA: Diagnosis present

## 2019-09-01 DIAGNOSIS — Z7982 Long term (current) use of aspirin: Secondary | ICD-10-CM

## 2019-09-01 DIAGNOSIS — N179 Acute kidney failure, unspecified: Secondary | ICD-10-CM | POA: Diagnosis present

## 2019-09-01 DIAGNOSIS — I10 Essential (primary) hypertension: Secondary | ICD-10-CM | POA: Diagnosis present

## 2019-09-01 LAB — PROCALCITONIN: Procalcitonin: 0.34 ng/mL

## 2019-09-01 LAB — CBC WITH DIFFERENTIAL/PLATELET
Abs Immature Granulocytes: 0.08 10*3/uL — ABNORMAL HIGH (ref 0.00–0.07)
Basophils Absolute: 0 10*3/uL (ref 0.0–0.1)
Basophils Relative: 0 %
Eosinophils Absolute: 0 10*3/uL (ref 0.0–0.5)
Eosinophils Relative: 0 %
HCT: 43.7 % (ref 39.0–52.0)
Hemoglobin: 13.9 g/dL (ref 13.0–17.0)
Immature Granulocytes: 1 %
Lymphocytes Relative: 14 %
Lymphs Abs: 1.6 10*3/uL (ref 0.7–4.0)
MCH: 32.3 pg (ref 26.0–34.0)
MCHC: 31.8 g/dL (ref 30.0–36.0)
MCV: 101.6 fL — ABNORMAL HIGH (ref 80.0–100.0)
Monocytes Absolute: 1.1 10*3/uL — ABNORMAL HIGH (ref 0.1–1.0)
Monocytes Relative: 9 %
Neutro Abs: 9.2 10*3/uL — ABNORMAL HIGH (ref 1.7–7.7)
Neutrophils Relative %: 76 %
Platelets: 201 10*3/uL (ref 150–400)
RBC: 4.3 MIL/uL (ref 4.22–5.81)
RDW: 13.9 % (ref 11.5–15.5)
WBC: 12 10*3/uL — ABNORMAL HIGH (ref 4.0–10.5)
nRBC: 0 % (ref 0.0–0.2)

## 2019-09-01 LAB — MAGNESIUM: Magnesium: 2 mg/dL (ref 1.7–2.4)

## 2019-09-01 LAB — COMPREHENSIVE METABOLIC PANEL
ALT: 15 U/L (ref 0–44)
AST: 17 U/L (ref 15–41)
Albumin: 3.1 g/dL — ABNORMAL LOW (ref 3.5–5.0)
Alkaline Phosphatase: 51 U/L (ref 38–126)
Anion gap: 10 (ref 5–15)
BUN: 30 mg/dL — ABNORMAL HIGH (ref 8–23)
CO2: 31 mmol/L (ref 22–32)
Calcium: 7.9 mg/dL — ABNORMAL LOW (ref 8.9–10.3)
Chloride: 102 mmol/L (ref 98–111)
Creatinine, Ser: 1.44 mg/dL — ABNORMAL HIGH (ref 0.61–1.24)
GFR calc Af Amer: 52 mL/min — ABNORMAL LOW (ref >60)
GFR calc non Af Amer: 45 mL/min — ABNORMAL LOW (ref >60)
Glucose, Bld: 109 mg/dL — ABNORMAL HIGH (ref 70–99)
Potassium: 4 mmol/L (ref 3.5–5.1)
Sodium: 143 mmol/L (ref 135–145)
Total Bilirubin: 0.4 mg/dL (ref 0.3–1.2)
Total Protein: 6.8 g/dL (ref 6.5–8.1)

## 2019-09-01 LAB — D-DIMER, QUANTITATIVE: D-Dimer, Quant: 6.33 ug/mL-FEU — ABNORMAL HIGH (ref 0.00–0.50)

## 2019-09-01 LAB — C-REACTIVE PROTEIN: CRP: 8.1 mg/dL — ABNORMAL HIGH (ref <1.0)

## 2019-09-01 LAB — FERRITIN: Ferritin: 257 ng/mL (ref 24–336)

## 2019-09-01 LAB — ABO/RH: ABO/RH(D): O POS

## 2019-09-01 LAB — SARS CORONAVIRUS 2 (TAT 6-24 HRS): SARS Coronavirus 2: POSITIVE — AB

## 2019-09-01 MED ORDER — ORAL CARE MOUTH RINSE
15.0000 mL | Freq: Two times a day (BID) | OROMUCOSAL | Status: DC
  Administered 2019-09-02 – 2019-09-04 (×4): 15 mL via OROMUCOSAL

## 2019-09-01 MED ORDER — ALBUTEROL SULFATE HFA 108 (90 BASE) MCG/ACT IN AERS
4.0000 | INHALATION_SPRAY | RESPIRATORY_TRACT | Status: DC | PRN
  Filled 2019-09-01: qty 6.7

## 2019-09-01 MED ORDER — GUAIFENESIN-DM 100-10 MG/5ML PO SYRP: 10.0000 mL | ORAL_SOLUTION | ORAL | Status: DC | PRN

## 2019-09-01 MED ORDER — ONDANSETRON HCL 4 MG PO TABS: 4.0000 mg | ORAL_TABLET | Freq: Four times a day (QID) | ORAL | Status: DC | PRN

## 2019-09-01 MED ORDER — METOPROLOL SUCCINATE ER 25 MG PO TB24
25.0000 mg | ORAL_TABLET | Freq: Every day | ORAL | Status: DC
  Administered 2019-09-02 – 2019-09-03 (×2): 25 mg via ORAL
  Filled 2019-09-01 (×2): qty 1

## 2019-09-01 MED ORDER — ACETAMINOPHEN 325 MG PO TABS
650.0000 mg | ORAL_TABLET | Freq: Four times a day (QID) | ORAL | Status: DC | PRN
  Filled 2019-09-01: qty 2

## 2019-09-01 MED ORDER — ZINC SULFATE 220 (50 ZN) MG PO CAPS
220.0000 mg | ORAL_CAPSULE | Freq: Every day | ORAL | Status: DC
  Administered 2019-09-02 – 2019-09-03 (×2): 220 mg via ORAL
  Filled 2019-09-01 (×2): qty 1

## 2019-09-01 MED ORDER — IPRATROPIUM BROMIDE HFA 17 MCG/ACT IN AERS
2.0000 | INHALATION_SPRAY | RESPIRATORY_TRACT | Status: DC | PRN
  Filled 2019-09-01: qty 12.9

## 2019-09-01 MED ORDER — ADULT MULTIVITAMIN W/MINERALS CH
1.0000 | ORAL_TABLET | Freq: Every day | ORAL | Status: DC
  Administered 2019-09-02 – 2019-09-03 (×2): 1 via ORAL
  Filled 2019-09-01 (×2): qty 1

## 2019-09-01 MED ORDER — CARVEDILOL 6.25 MG PO TABS: 3.1250 mg | ORAL_TABLET | Freq: Two times a day (BID) | ORAL | Status: DC

## 2019-09-01 MED ORDER — HEPARIN SODIUM (PORCINE) 5000 UNIT/ML IJ SOLN
5000.0000 [IU] | Freq: Three times a day (TID) | INTRAMUSCULAR | Status: DC
  Administered 2019-09-01 – 2019-09-03 (×6): 5000 [IU] via SUBCUTANEOUS
  Filled 2019-09-01 (×6): qty 1

## 2019-09-01 MED ORDER — DEXAMETHASONE SODIUM PHOSPHATE 10 MG/ML IJ SOLN
6.0000 mg | Freq: Two times a day (BID) | INTRAMUSCULAR | Status: DC
  Administered 2019-09-01 – 2019-09-03 (×5): 6 mg via INTRAVENOUS
  Filled 2019-09-01 (×5): qty 1

## 2019-09-01 MED ORDER — ASPIRIN EC 81 MG PO TBEC
81.0000 mg | DELAYED_RELEASE_TABLET | Freq: Every day | ORAL | Status: DC
  Administered 2019-09-01 – 2019-09-02 (×2): 81 mg via ORAL
  Filled 2019-09-01 (×2): qty 1

## 2019-09-01 MED ORDER — HYDRALAZINE HCL 25 MG PO TABS
25.0000 mg | ORAL_TABLET | Freq: Four times a day (QID) | ORAL | Status: DC | PRN
  Administered 2019-09-01: 22:00:00 25 mg via ORAL
  Filled 2019-09-01: qty 1
  Filled 2019-09-01: qty 0.5

## 2019-09-01 MED ORDER — METOPROLOL SUCCINATE ER 25 MG PO TB24
25.0000 mg | ORAL_TABLET | Freq: Every day | ORAL | Status: DC
  Administered 2019-09-01: 25 mg via ORAL
  Filled 2019-09-01: qty 1

## 2019-09-01 MED ORDER — FUROSEMIDE 40 MG PO TABS
60.0000 mg | ORAL_TABLET | Freq: Every day | ORAL | Status: DC
  Administered 2019-09-01: 60 mg via ORAL
  Filled 2019-09-01: qty 2

## 2019-09-01 MED ORDER — VITAMIN C 500 MG PO TABS
500.0000 mg | ORAL_TABLET | Freq: Every day | ORAL | Status: DC
  Administered 2019-09-02 – 2019-09-03 (×2): 500 mg via ORAL
  Filled 2019-09-01 (×2): qty 1

## 2019-09-01 MED ORDER — ESCITALOPRAM OXALATE 10 MG PO TABS
10.0000 mg | ORAL_TABLET | Freq: Every day | ORAL | Status: DC
  Administered 2019-09-01 – 2019-09-02 (×2): 10 mg via ORAL
  Filled 2019-09-01 (×3): qty 1

## 2019-09-01 MED ORDER — QUETIAPINE FUMARATE 25 MG PO TABS
25.0000 mg | ORAL_TABLET | Freq: Every day | ORAL | Status: DC
  Administered 2019-09-01: 22:00:00 25 mg via ORAL
  Filled 2019-09-01: qty 1

## 2019-09-01 MED ORDER — HYDROCOD POLST-CPM POLST ER 10-8 MG/5ML PO SUER: 5.0000 mL | Freq: Two times a day (BID) | ORAL | Status: DC | PRN

## 2019-09-01 MED ORDER — BUDESONIDE 180 MCG/ACT IN AEPB: 1.0000 | INHALATION_SPRAY | Freq: Two times a day (BID) | RESPIRATORY_TRACT | Status: DC

## 2019-09-01 MED ORDER — ONDANSETRON HCL 4 MG/2ML IJ SOLN
4.0000 mg | Freq: Four times a day (QID) | INTRAMUSCULAR | Status: DC | PRN
  Administered 2019-09-04: 03:00:00 4 mg via INTRAVENOUS
  Filled 2019-09-01: qty 2

## 2019-09-01 MED ORDER — GABAPENTIN 100 MG PO CAPS
100.0000 mg | ORAL_CAPSULE | Freq: Two times a day (BID) | ORAL | Status: DC
  Administered 2019-09-01 – 2019-09-03 (×4): 100 mg via ORAL
  Filled 2019-09-01 (×5): qty 1

## 2019-09-01 MED ORDER — SODIUM CHLORIDE 0.9 % IV SOLN
100.0000 mg | Freq: Every day | INTRAVENOUS | Status: DC
  Administered 2019-09-02 – 2019-09-03 (×2): 100 mg via INTRAVENOUS
  Filled 2019-09-01 (×2): qty 20

## 2019-09-01 MED ORDER — LORAZEPAM 2 MG/ML IJ SOLN: 0.5000 mg | Freq: Once | INTRAMUSCULAR | Status: DC

## 2019-09-01 MED ORDER — SIMVASTATIN 20 MG PO TABS
20.0000 mg | ORAL_TABLET | Freq: Every day | ORAL | Status: DC
  Administered 2019-09-01 – 2019-09-02 (×2): 20 mg via ORAL
  Filled 2019-09-01 (×2): qty 1

## 2019-09-01 MED ORDER — DOCUSATE SODIUM 100 MG PO CAPS
100.0000 mg | ORAL_CAPSULE | Freq: Two times a day (BID) | ORAL | Status: DC
  Administered 2019-09-01 – 2019-09-03 (×4): 100 mg via ORAL
  Filled 2019-09-01 (×4): qty 1

## 2019-09-01 NOTE — Progress Notes (Signed)
Pt tx from [facility]. He got 2 doses of remdesivir there. We will continue here to finish out the course.   Onnie Boer, PharmD, BCIDP, AAHIVP, CPP Infectious Disease Pharmacist 09/01/2019 7:24 PM

## 2019-09-01 NOTE — H&P (Signed)
[facility] History and Physical  Molly Armbrecht [id number] DOB: 08/16/1934 DOA: 09/01/2019   PCP: [name], MD  Specialists: None  Chief Complaint: Shortness of breath  HPI: Alan Ortega is a 84 y.o. male with a past medical history of dementia, coronary artery disease, history of COPD (unclear if he is on home oxygen or not), history of essential hypertension, chronic systolic CHF.  Patient is a poor historian due to his dementia.  Most of the history was obtained from the ER physician notes as well as the hospitalist H&P done at [facility].  Apparently patient had exposure to Covid 19 at end of November.  He initially tested negative for the virus.  He started having symptoms of viral infections and patient's primary care physician ordered Z-Pak and steroids.  Subsequently patient started having fever and chills.  Started feeling a little short of breath.  He was also noted to be more confused at home.  He was noted to be hypoxic.  He was noted to be positive for COVID-19 in the emergency department.  Chest x-ray was also suggestive of bilateral opacities.  He was subsequently hospitalized for further management.  He was transferred to [facility].  Patient is currently pleasantly confused.  He states that he is feeling better.  States that he wants to go home.  Denies any chest pain.  No nausea or vomiting.  Unable to provide good history due to his dementia.   Home Medications: Home medication list needs to be verified. Prior to Admission medications   Medication Sig Start Date End Date Taking? Authorizing Provider  albuterol (VENTOLIN HFA) 108 (90 Base) MCG/ACT inhaler Inhale 2 puffs into the lungs every 6 (six) hours as needed. 05/14/19   [provider]  aspirin EC 81 MG tablet Take 81 mg by mouth at bedtime.     [provider]  budesonide (PULMICORT) 0.25 MG/2ML nebulizer solution Take 2 mLs (0.25 mg total) by  nebulization 2 (two) times daily. 10/10/18   Dustin Flock, MD  carvedilol (COREG) 3.125 MG tablet Take 1 tablet (3.125 mg total) by mouth 2 (two) times daily with a meal. Patient not taking: Reported on 08/31/2019 10/10/18   Dustin Flock, MD  escitalopram (LEXAPRO) 10 MG tablet Take 10 mg by mouth at bedtime.     [provider]  furosemide (LASIX) 40 MG tablet Take 60 mg by mouth daily. 08/14/19   [provider]  gabapentin (NEURONTIN) 100 MG capsule Take 100 mg by mouth 2 (two) times daily. 07/21/19   [provider]  ipratropium-albuterol (DUONEB) 0.5-2.5 (3) MG/3ML SOLN Take 3 mLs by nebulization every 6 (six) hours. 10/10/18   Dustin Flock, MD  metoprolol succinate (TOPROL-XL) 25 MG 24 hr tablet Take 25 mg by mouth daily. 08/14/19   [provider]  nystatin (MYCOSTATIN/NYSTOP) powder Apply topically. 09/27/18 09/27/19  [provider]  predniSONE (DELTASONE) 20 MG tablet Take 80 mg by mouth daily. 08/27/19   [provider]  simvastatin (ZOCOR) 20 MG tablet Take 20 mg by mouth at bedtime.     [provider]  tiotropium (SPIRIVA HANDIHALER) 18 MCG inhalation capsule Place 1 capsule (18 mcg total) into inhaler and inhale daily. Patient not taking: Reported on 08/31/2019 10/10/18 10/10/19  Dustin Flock, MD    Allergies: No Known Allergies  Past Medical History: Past Medical History:  Diagnosis Date  . CAD (coronary artery disease)   . Cancer [facility])    Prostate  .  COPD (chronic obstructive pulmonary disease) ([place])   . HLD (hyperlipidemia)   . Hypertension     Past Surgical History:  Procedure Laterality Date  . CHOLECYSTECTOMY    . COLONOSCOPY    . CORONARY ANGIOPLASTY    . CORONARY ARTERY BYPASS GRAFT    . EXCISION MASS LOWER EXTREMETIES Right 09/09/2018   Procedure: EXCISION MASS LOWER EXTREMETIES;  Surgeon: Benjamine Sprague, DO;  Location: [facility] ORS;  Service: General;  Laterality: Right;  . TONSILLECTOMY       Social History: Apparently lives with his daughter.  No other information is available.   Family History:  Family History  Problem Relation Age of Onset  . Heart disease Mother   . Stroke Father   . Kidney disease Brother      Review of Systems -unable to do due to his dementia  Physical Examination  Vitals:   09/01/19 1717 09/01/19 1800  BP: (!) 137/94 (!) 151/88  Pulse: 69 62  Resp: 16 (!) 22  Temp: 97.8 F (36.6 C)   TempSrc: Oral   SpO2: 97% 98%    BP (!) 151/88   Pulse 62   Temp 97.8 F (36.6 C) (Oral)   Resp (!) 22   SpO2 98%   General appearance: alert, cooperative, appears stated age and distracted Head: Normocephalic, without obvious abnormality, atraumatic Eyes: conjunctivae/corneas clear. PERRL, EOM's intact.  Throat: lips, mucosa, and tongue normal; teeth and gums normal Neck: no adenopathy, no carotid bruit, no JVD, supple, symmetrical, trachea midline and thyroid not enlarged, symmetric, no tenderness/mass/nodules Resp: Normal effort at rest.  Coarse breath sounds bilaterally.  Few crackles at the bases bilaterally.  No wheezing or rhonchi. Cardio: S1-S2 is irregular.  No S3-S4.  No rubs murmurs or bruit. GI: soft, non-tender; bowel sounds normal; no masses,  no organomegaly Extremities: extremities normal, atraumatic, no cyanosis or edema Pulses: 2+ and symmetric Skin: Skin color, texture, turgor normal. No rashes or lesions Lymph nodes: Cervical, supraclavicular, and axillary nodes normal. Neurologic: He is awake alert.  Pleasantly confused.  No obvious focal neurological deficits.   Labs on Admission: I have personally reviewed following labs and imaging studies  CBC: Recent Labs  Lab 08/31/19 1344  WBC 13.3*  NEUTROABS 11.1*  HGB 13.9  HCT 42.2  MCV 97.5  PLT [id number]   Basic Metabolic Panel: Recent Labs  Lab 08/31/19 1344  NA 142  K 2.8*  CL 104  CO2 28  GLUCOSE 94  BUN 25*  CREATININE 1.46*  CALCIUM 7.7*   GFR: Estimated  Creatinine Clearance: 35.2 mL/min (A) (by C-G formula based on SCr of 1.46 mg/dL (H)). Liver Function Tests: Recent Labs  Lab 08/31/19 1344  AST 16  ALT 11  ALKPHOS 52  BILITOT 1.1  PROT 6.8  ALBUMIN 3.2*     Radiological Exams on Admission: CT Angio Chest PE W and/or Wo Contrast  Result Date: 08/31/2019 CLINICAL DATA:  Hypoxia on home oxygen. More confused and normal. Recent COVID-19 exposure. Elevated D-dimer. Evaluate for pulmonary embolism. EXAM: CT ANGIOGRAPHY CHEST WITH CONTRAST TECHNIQUE: Multidetector CT imaging of the chest was performed using the standard protocol during bolus administration of intravenous contrast. Multiplanar CT image reconstructions and MIPs were obtained to evaluate the vascular anatomy. CONTRAST:  5mL OMNIPAQUE IOHEXOL 350 MG/ML SOLN COMPARISON:  09/13/2013 FINDINGS: Cardiovascular: Heart is normal in size. Calcified plaque is present over the left main and 3 vessel coronary arteries. There is mild calcified plaque over the thoracic aorta. Thoracic aorta is  normal caliber without dissection or aneurysm. Pulmonary arterial system is well opacified without evidence of emboli. Remaining vascular structures are unremarkable. Mediastinum/Nodes: 0.1 cm AP window lymph node reactive. No other significant mediastinal or hilar adenopathy. Remaining mediastinal structures are normal. Lungs/Pleura: Lungs are adequately inflated with moderate centrilobular emphysematous disease. There is mild posterior dependent bibasilar atelectasis left worse than right. No evidence of effusion. No lobar consolidation. Airways are unremarkable. Upper Abdomen: Calcified plaque over the abdominal aorta. There are a few small hypodensities over the liver unchanged likely cysts. Subtle cystic change over the upper pole left kidney unchanged. Stable 1.6 cm left adrenal nodule likely adenoma. Musculoskeletal: Degenerative change of the spine. Review of the MIP images confirms the above findings.  IMPRESSION: 1. No evidence of pulmonary emboli. No acute cardiopulmonary disease. Minimal dependent bibasilar atelectasis. 2. Aortic Atherosclerosis (ICD10-I70.0) and Emphysema (ICD10-J43.9). Atherosclerotic coronary artery disease. 3. Few small liver hypodensities unchanged likely cysts. Minimal cystic change over the upper pole left kidney unchanged. 1.6 cm left adrenal nodule unchanged likely an adenoma. Electronically Signed   By: Marin Olp M.D.   On: 08/31/2019 16:24   DG Chest Portable 1 View  Result Date: 08/31/2019 CLINICAL DATA:  Shortness of breath, altered mental status, hypoxia EXAM: PORTABLE CHEST 1 VIEW COMPARISON:  Portable exam 1346 hours compared to 10/08/2018 FINDINGS: Upper normal size of cardiac silhouette post CABG. Atherosclerotic calcification aorta. Mediastinal contours and pulmonary vascularity otherwise normal. Emphysematous and minimal bronchitic consistent with COPD. Bibasilar interstitial infiltrates, chronic unchanged since 10/08/2018. Upper lungs clear. No pleural effusion or pneumothorax. IMPRESSION: Borderline enlargement of cardiac silhouette post CABG. COPD changes with bibasilar interstitial infiltrates; these could represent chronic interstitial disease or recurrent bibasilar pneumonia. Electronically Signed   By: Lavonia Dana M.D.   On: 08/31/2019 14:15    My interpretation of Electrocardiogram: Sinus arrhythmia.  98 bpm.  RBBB.  Normal axis.  No concerning ischemic changes.   Problem List  Principal Problem:   Pneumonia due to COVID-19 virus Active Problems:   HTN (hypertension)   COPD (chronic obstructive pulmonary disease) (HCC)   Acute respiratory failure with hypoxia (HCC)   History of prostate cancer   Chronic systolic (congestive) heart failure [facility])   Assessment: 85 year old Caucasian male with past medical history as stated earlier who presents with shortness of breath.  Noted to be hypoxic.  Found to be positive for COVID-19.  Chest x-ray  suggestive of pneumonia.  Plan:  Pneumonia due to COVID-19/acute respiratory failure with hypoxia Patient currently requiring 4 L of oxygen saturating in the early 90s.  He does not have increased work of breathing currently.  Patient has already received 2 doses of remdesivir starting 12/9.  He will get 3 more doses starting tomorrow.  He will be placed on steroids.  No inflammatory markers have been checked.  These will be ordered.  Incentive spirometry, flutter valve, mobilization.  Prone positioning as much as tolerated.  CT Angiogram did not show any PE.  We will trend D-dimer.  Noted to be 5.4 when checked at [facility].  Influenza PCR was negative.  WBC was noted to be elevated at 13.3.  He did get vancomycin and cefepime in the emergency department yesterday.  Check procalcitonin before deciding on antibacterials.  Abnormal UA Small hemoglobin, large leukocytes and 21-50 WBCs noted.  Unable to determine if the patient is symptomatic.  Check procalcitonin.  Patient did receive vancomycin and cefepime yesterday.  Hold off on antibiotics for now until we have procalcitonin back.  History of COPD Unclear if patient is uses oxygen at home.  He thinks he does.  Will need to verify this with family.  Does not appear to have any acute exacerbation currently.  Continue with his inhalers.  Chronic systolic CHF Based on echocardiogram done in January 2020 his EF is 30 to 35%.  It appears that he was given Lasix intravenously earlier today.  He was also given potassium.  We will recheck labs tonight.  Hold off on further doses of Lasix tonight.  Seems to be euvolemic currently.  We will determine tomorrow morning.  History of coronary artery disease status post CABG Stable.  Denies any chest pain.  Continue aspirin.  Continue beta-blocker.  Hypokalemia This was repleted.  We will recheck.  Check magnesium.  Elevated creatinine Last available creatinine was 1.2 from earlier this year.  Patient likely  has chronic kidney disease unknown stage.  Recheck labs.  Monitor urine output.  DVT Prophylaxis: Subcutaneous heparin Code Status: DNR/DNI Family Communication: We will discuss with family members.  Patient apparently lives with his daughter. Disposition: Telemetry Consults called: None Admission Status: Inpatient  Severity of Illness: The appropriate patient status for this patient is INPATIENT. Inpatient status is judged to be reasonable and necessary in order to provide the required intensity of service to ensure the patient's safety. The patient's presenting symptoms, physical exam findings, and initial radiographic and laboratory data in the context of their chronic comorbidities is felt to place them at high risk for further clinical deterioration. Furthermore, it is not anticipated that the patient will be medically stable for discharge from the hospital within 2 midnights of admission. The following factors support the patient status of inpatient.   " The patient's presenting symptoms include shortness of breath. " The worrisome physical exam findings include hypoxia. " The initial radiographic and laboratory data are worrisome because of pneumonia. " The chronic co-morbidities include dementia and chronic systolic CHF.   * I certify that at the point of admission it is my clinical judgment that the patient will require inpatient hospital care spanning beyond 2 midnights from the point of admission due to high intensity of service, high risk for further deterioration and high frequency of surveillance required.*  Further management decisions will depend on results of further testing and patient's response to treatment.   Jinny Sweetland [employer]  [facility] operational officer on [email or web address]  09/01/2019, 6:37 PM

## 2019-09-01 NOTE — ED Notes (Signed)
Answered call bell, pt incontinent of urine. Pt able to walk to toilet with assist. Bed changed. Pt assisted to stretcher.

## 2019-09-01 NOTE — Progress Notes (Signed)
Patient has bed available at [facility] and is being transferred there.

## 2019-09-01 NOTE — ED Notes (Signed)
emtala reviewed 

## 2019-09-01 NOTE — ED Notes (Signed)
Pt was assisted with the urinal

## 2019-09-01 NOTE — ED Notes (Signed)
Pt lying in bed, exhibiting no signs of acute distress., VS WNL  Will continue to monitor

## 2019-09-01 NOTE — Progress Notes (Signed)
Earlier in evening RN reported patient with increased heart rate and increased work of breathing with slight decrease in oxygen saturation.  Chart review, patient with significant systolic heart failure and prior severe bradycardia with coreg. Also noted patient.  Despite mild increase noted in patient's creatinine, lasix therapy resumed secondary to symptoms, history and + fluid balance.  Metoprolol also resumed secondary to increased heart rate and frequent PVC's. Plan for electrolyte optimization, keep K f>4, and mag above 2.

## 2019-09-01 NOTE — Progress Notes (Signed)
Patient daughter Arrie Aran called the nurse and was updated

## 2019-09-01 NOTE — ED Notes (Signed)
Pt wheezing after going to toilet. Albuterol inhaler given with good results.

## 2019-09-02 DIAGNOSIS — E876 Hypokalemia: Secondary | ICD-10-CM

## 2019-09-02 DIAGNOSIS — N390 Urinary tract infection, site not specified: Secondary | ICD-10-CM

## 2019-09-02 MED ORDER — QUETIAPINE FUMARATE 25 MG PO TABS: 25.0000 mg | ORAL_TABLET | Freq: Two times a day (BID) | ORAL | Status: DC

## 2019-09-02 MED ORDER — QUETIAPINE FUMARATE 25 MG PO TABS
25.0000 mg | ORAL_TABLET | Freq: Every day | ORAL | Status: DC
  Administered 2019-09-02: 25 mg via ORAL
  Filled 2019-09-02: qty 1

## 2019-09-02 MED ORDER — SODIUM CHLORIDE 0.9 % IV SOLN
1.0000 g | INTRAVENOUS | Status: DC
  Administered 2019-09-02 – 2019-09-03 (×2): 1 g via INTRAVENOUS
  Filled 2019-09-02 (×2): qty 10

## 2019-09-02 MED ORDER — FAMOTIDINE IN NACL 20-0.9 MG/50ML-% IV SOLN
20.0000 mg | INTRAVENOUS | Status: DC
  Administered 2019-09-02 – 2019-09-03 (×2): 20 mg via INTRAVENOUS
  Filled 2019-09-02 (×2): qty 50

## 2019-09-02 NOTE — Plan of Care (Signed)
  Problem: Clinical Measurements: Goal: Respiratory complications will improve Outcome: Progressing   

## 2019-09-02 NOTE — Evaluation (Signed)
Clinical/Bedside Swallow Evaluation Patient Details  Name: Alan Ortega MRN: [id number] Date of Birth: 11-03-1933  Today's Date: 09/02/2019 Time: SLP Start Time (ACUTE ONLY): 1026 SLP Stop Time (ACUTE ONLY): 1041 SLP Time Calculation (min) (ACUTE ONLY): 15 min  Past Medical History:  Past Medical History:  Diagnosis Date  . CAD (coronary artery disease)   . Cancer [facility])    Prostate  . COPD (chronic obstructive pulmonary disease) ([place])   . HLD (hyperlipidemia)   . Hypertension    Past Surgical History:  Past Surgical History:  Procedure Laterality Date  . CHOLECYSTECTOMY    . COLONOSCOPY    . CORONARY ANGIOPLASTY    . CORONARY ARTERY BYPASS GRAFT    . EXCISION MASS LOWER EXTREMETIES Right 09/09/2018   Procedure: EXCISION MASS LOWER EXTREMETIES;  Surgeon: Benjamine Sprague, DO;  Location: [facility] ORS;  Service: General;  Laterality: Right;  . TONSILLECTOMY     HPI:  Alan Ortega is a 85 y.o. male with a past medical history of dementia, coronary artery disease, COPD, history of essential hypertension, chronic systolic CHF. Pt hypoxic, tested + Covid. Chest CT No acute cardiopulmonary   Assessment / Plan / Recommendation Clinical Impression  Pt did not demonstrate indications of airway compromise with po's. He was alert, lucid and participatory stating he has had pna 3 other times in about 13 months. Slight increased work of breathing at rest and RR stable in lower 20's. Breakfast tray still on table in front of him and he had residue- small particles sausage he was masticating. Consumed eggs leaving min-mild lingual residue and stated he would be open to having meats pre chopped on trays. No s/s aspiration with thin although pt's with COPD are at a higher risk of min aspiration which is typically not outwardly detectable. Recommend downgrade to Dys 3, continue thin, pills with thin. Once he recovers from Covid he might benefit from outpatient MBS to assess oropharyngeal swallow  function. Will not follow up while in acute care.     SLP Visit Diagnosis: Dysphagia, unspecified (R13.10)    Aspiration Risk  Mild aspiration risk    Diet Recommendation Dysphagia 3 (Mech soft);Thin liquid   Liquid Administration via: Cup;Straw Medication Administration: Whole meds with liquid Supervision: Patient able to self feed;Intermittent supervision to cue for compensatory strategies Compensations: Slow rate;Small sips/bites;Other (Comment)(rest breaks) Postural Changes: Seated upright at 90 degrees    Other  Recommendations Oral Care Recommendations: Oral care BID   Follow up Recommendations None      Frequency and Duration            Prognosis        Swallow Study   General HPI: Alan Ortega is a 85 y.o. male with a past medical history of dementia, coronary artery disease, COPD, history of essential hypertension, chronic systolic CHF. Pt hypoxic, tested + Covid. Chest CT No acute cardiopulmonary Type of Study: Bedside Swallow Evaluation Previous Swallow Assessment: (none) Diet Prior to this Study: Regular Temperature Spikes Noted: No Respiratory Status: Nasal cannula History of Recent Intubation: No Behavior/Cognition: Alert;Cooperative;Pleasant mood Oral Cavity Assessment: Within Functional Limits Oral Care Completed by SLP: No Oral Cavity - Dentition: Other (Comment)(missing some upper and lower) Vision: Functional for self-feeding Self-Feeding Abilities: Able to feed self Patient Positioning: Upright in bed Baseline Vocal Quality: Normal Volitional Cough: Strong Volitional Swallow: Able to elicit    Oral/Motor/Sensory Function Overall Oral Motor/Sensory Function: Within functional limits   Ice Chips Ice chips: Not tested  Thin Liquid Thin Liquid: Within functional limits Presentation: Cup    Nectar Thick Nectar Thick Liquid: Not tested   Honey Thick Honey Thick Liquid: Not tested   Puree Puree: Not tested   Solid     Solid: Impaired Oral  Phase Impairments: Impaired mastication Oral Phase Functional Implications: Oral residue      Houston Siren 09/02/2019,11:04 AM  Orbie Pyo Colvin Caroli.Ed Risk analyst [phone] Office [phone]

## 2019-09-02 NOTE — Progress Notes (Signed)
Pt A&O x 4 when care assumed, now confused and yelling for his mother. Pt BP elevated, agitation noted. MD informed of pt status. Information acknowledged, orders pending for PRN and scheduled interventions.

## 2019-09-02 NOTE — Progress Notes (Addendum)
PROGRESS NOTE  Alan Ortega [id number] DOB: 02-Jan-1934 DOA: 09/01/2019  PCP: [name], MD  Brief History/Interval Summary: 85 y.o. male with a past medical history of dementia, coronary artery disease, history of COPD (unclear if he is on home oxygen or not), history of essential hypertension, chronic systolic CHF.  Patient is a poor historian due to his dementia.  Most of the history was obtained from the ER physician notes as well as the hospitalist H&P done at [facility].  Apparently patient had exposure to Covid 19 at end of November.  He initially tested negative for the virus.  He started having symptoms of viral infections and patient's primary care physician ordered Z-Pak and steroids.  Subsequently patient started having fever and chills.  Started feeling a little short of breath.  He was also noted to be more confused at home.  He was noted to be hypoxic.  He was noted to be positive for COVID-19 in the emergency department.  Chest x-ray was also suggestive of bilateral opacities.  He was subsequently hospitalized for further management.  He was transferred to [facility].   Reason for Visit: Pneumonia due to COVID-19.  Acute respiratory failure with hypoxia.  Consultants: None  Procedures: None  Antibiotics: Anti-infectives (From admission, onward)   Start     Dose/Rate Route Frequency Ordered Stop   09/02/19 1000  remdesivir 100 mg in sodium chloride 0.9 % 100 mL IVPB     100 mg 200 mL/hr over 30 Minutes Intravenous Daily 09/01/19 1917 09/05/19 0959   09/02/19 0830  cefTRIAXone (ROCEPHIN) 1 g in sodium chloride 0.9 % 100 mL IVPB     1 g 200 mL/hr over 30 Minutes Intravenous Every 24 hours 09/02/19 0739 09/11/19 0829      Subjective/Interval History: Patient noted to be somnolent this morning.  Opens his eyes but then goes back to sleep.  Does not appear to be in any distress.    Assessment/Plan:  Acute Hypoxic Resp.  Failure/Pneumonia due to COVID-19  COVID-19 Labs  Lab Results  Component Value Date   SARSCOV2NAA POSITIVE (A) 08/31/2019   [place] Not Detected 08/26/2019     Recent Labs  Lab 08/31/19 1344 09/01/19 1935  DDIMER  --  6.33*  FERRITIN  --  257  CRP  --  8.1*  ALT 11 15  PROCALCITON  --  0.34    Objective findings: Fever: Afebrile Oxygen requirements: Nasal cannula.  2 L/min.  Saturating in the early 90s.  COVID 19 Therapeutics: Antibacterials: Ceftriaxone day 1 today Remdesivir: Day 3 today Steroids: Dexamethasone 6 mg q12h Diuretics: Not on a scheduled basis here in the hospital Actemra: Not given yet Convalescent Plasma: Not given yet Vitamin C and Zinc: Continue PUD Prophylaxis: Start Pepcid DVT Prophylaxis: Subcutaneous heparin  Patient seems to be stable from a respiratory standpoint.  He remains on steroids and remdesivir.  He is requiring only about 2 L of oxygen.  His D-dimer is noted to be elevated at 6.33 which is most likely due to the COVID-19.  CT angiogram was done at the time of admission and did not show any PE.  Continue to trend D-dimer.  CRP noted to be elevated at 8.1.  Procalcitonin 0.34.  Ferritin 257.  If patient is able to cooperate encourage him to do incentive spirometry.  Mobilize him as much as possible.  Abnormal UA/suspected UTI UA was noted to be abnormal.  Due to his dementia it was difficult  to ascertain if the patient was symptomatic or not.  His procalcitonin is minimally elevated.  We have started him on ceftriaxone.  Continue to monitor for now.  History of COPD Unclear if he uses oxygen at home.  I will discuss and inquire from his family.  Continue with his inhalers.  Chronic systolic CHF Based on echocardiogram done in January 2020 his EF is 30 to 35%.  He was given intravenous Lasix while he was in [facility].  Volume status seems to be stable.  Renal function is stable this morning.  He is on Lasix 40 mg daily at home.  Hold off on  diuretics for now.  Monitor ins and outs and daily weights.  Monitor urine output.  Coronary artery disease status post CABG Stable.  Continue aspirin beta-blocker.  Hypokalemia Potassium was 2.8 yesterday.  Noted to be 4.0 this morning.  Magnesium is 2.0.  Elevated creatinine Creatinine was 1.2 earlier this year.  Noted to be 1.44 this morning.  Likely has some element of chronic kidney disease, unknown stage.  Monitor urine output.  Continue to check labs.  DVT Prophylaxis: Subcutaneous heparin Code Status: DNR Family Communication: We will call his daughter Disposition Plan: He is apparently from home.  PT and OT to be involved.   Medications:  Scheduled: . aspirin EC  81 mg Oral QHS  . dexamethasone (DECADRON) injection  6 mg Intravenous Q12H  . docusate sodium  100 mg Oral BID  . escitalopram  10 mg Oral QHS  . gabapentin  100 mg Oral BID  . heparin  5,000 Units Subcutaneous Q8H  . mouth rinse  15 mL Mouth Rinse BID  . metoprolol succinate  25 mg Oral Daily  . multivitamin with minerals  1 tablet Oral Daily  . QUEtiapine  25 mg Oral QHS  . simvastatin  20 mg Oral QHS  . vitamin C  500 mg Oral Daily  . zinc sulfate  220 mg Oral Daily   Continuous: . cefTRIAXone (ROCEPHIN)  IV 1 g (09/02/19 1007)  . remdesivir 100 mg in NS 100 mL     [id number], albuterol, chlorpheniramine-HYDROcodone, guaiFENesin-dextromethorphan, hydrALAZINE, ondansetron **OR** ondansetron (ZOFRAN) IV   Objective:  Vital Signs  Vitals:   09/01/19 1800 09/01/19 2109 09/01/19 2212 09/02/19 0500  BP: (!) 151/88  (!) 184/102 (!) 141/77  Pulse: 62   82  Resp: (!) 22   17  Temp:  98.1 F (36.7 C)  97.6 F (36.4 C)  TempSrc:  Oral  Oral  SpO2: 98%   94%    Intake/Output Summary (Last 24 hours) at 09/02/2019 1030 Last data filed at 09/01/2019 2100 Gross per 24 hour  Intake --  Output 650 ml  Net -650 ml   There were no vitals filed for this visit.  General appearance: Somnolent  this morning.  No distress. Resp: Mildly tachypneic at rest.  Coarse breath sounds with crackles at the bases.  No wheezing or rhonchi. Cardio: S1-S2 is normal regular.  No S3-S4.  No rubs murmurs or bruit GI: Abdomen is soft.  Nontender nondistended.  Bowel sounds are present normal.  No masses organomegaly Extremities: No edema.   Neurologic: Distracted.  Disoriented.  No obvious focal deficits.   Lab Results:  Data Reviewed: I have personally reviewed following labs and imaging studies  CBC: Recent Labs  Lab 08/31/19 1344 09/01/19 1935  WBC 13.3* 12.0*  NEUTROABS 11.1* 9.2*  HGB 13.9 13.9  HCT 42.2 43.7  MCV 97.5 101.6*  PLT 217 [id number]    Basic Metabolic Panel: Recent Labs  Lab 08/31/19 1344 09/01/19 1935  NA 142 143  K 2.8* 4.0  CL 104 102  CO2 28 31  GLUCOSE 94 109*  BUN 25* 30*  CREATININE 1.46* 1.44*  CALCIUM 7.7* 7.9*  MG  --  2.0    GFR: Estimated Creatinine Clearance: 35.7 mL/min (A) (by C-G formula based on SCr of 1.44 mg/dL (H)).  Liver Function Tests: Recent Labs  Lab 08/31/19 1344 09/01/19 1935  AST 16 17  ALT 11 15  ALKPHOS 52 51  BILITOT 1.1 0.4  PROT 6.8 6.8  ALBUMIN 3.2* 3.1*    Anemia Panel: Recent Labs    09/01/19 1935  FERRITIN 257    Recent Results (from the past 240 hour(s))  Novel Coronavirus, NAA ([facility])     Status: None   Collection Time: 08/26/19  1:05 PM   Specimen: Nasopharyngeal(NP) swabs in vial transport medium   NASOPHARYNGE  TESTING  Result Value Ref Range Status   SARS-CoV-2, NAA Not Detected Not Detected Final    Comment: This nucleic acid amplification test was developed and its performance characteristics determined by [employer]. Nucleic acid amplification tests include PCR and TMA. This test has not been FDA cleared or approved. This test has been authorized by FDA under an Emergency Use Authorization (EUA). This test is only authorized for the duration of time the declaration that  circumstances exist justifying the authorization of the emergency use of in vitro diagnostic tests for detection of SARS-CoV-2 virus and/or diagnosis of COVID-19 infection under section 564(b)(1) of the Act, 21 U.S.C. [id number]) (1), unless the authorization is terminated or revoked sooner. When diagnostic testing is negative, the possibility of a false negative result should be considered in the context of a patient's recent exposures and the presence of clinical signs and symptoms consistent with COVID-19. An individual without symptoms of COVID-19 and who is not shedding SARS-CoV-2 virus would  expect to have a negative (not detected) result in this assay.   Blood culture (routine x 2)     Status: None (Preliminary result)   Collection Time: 08/31/19  1:44 PM   Specimen: BLOOD  Result Value Ref Range Status   Specimen Description BLOOD BLOOD LEFT FOREARM  Final   Special Requests   Final    BOTTLES DRAWN AEROBIC AND ANAEROBIC Blood Culture adequate volume   Culture   Final    NO GROWTH 2 DAYS Performed at [facility], [address], [place], [place] [address]    Report Status PENDING  Incomplete  Blood culture (routine x 2)     Status: None (Preliminary result)   Collection Time: 08/31/19  2:26 PM   Specimen: BLOOD  Result Value Ref Range Status   Specimen Description BLOOD BLOOD RIGHT HAND  Final   Special Requests   Final    BOTTLES DRAWN AEROBIC AND ANAEROBIC Blood Culture adequate volume   Culture   Final    NO GROWTH 2 DAYS Performed at [facility], [place], [place] [address]    Report Status PENDING  Incomplete  SARS CORONAVIRUS 2 (TAT 6-24 HRS) Nasopharyngeal Nasopharyngeal Swab     Status: Abnormal   Collection Time: 08/31/19  2:26 PM   Specimen: Nasopharyngeal Swab  Result Value Ref Range Status   SARS Coronavirus 2 POSITIVE (A) NEGATIVE Final    Comment: RESULT CALLED TO, READ BACK BY AND VERIFIED WITH: Juliane Lack RN 9:35  09/01/19 ([email or web address]) (NOTE) SARS-CoV-2 target nucleic acids are DETECTED. The SARS-CoV-2 RNA is generally detectable in upper and lower respiratory specimens during the acute phase of infection. Positive results are indicative of the presence of SARS-CoV-2 RNA. Clinical correlation with patient history and other diagnostic information is  necessary to determine patient infection status. Positive results do not rule out bacterial infection or co-infection with other viruses.  The expected result is Negative. Fact Sheet for Patients: SugarRoll.be Fact Sheet for Healthcare Providers: https://www.woods-mathews.com/ This test is not yet approved or cleared by the [place] FDA and  has been authorized for detection and/or diagnosis of SARS-CoV-2 by FDA under an Emergency Use Authorization (EUA). This EUA will remain  in effect (meaning this test can be used) for the  duration of the COVID-19 declaration under Section 564(b)(1) of the Act, 21 U.S.C. section 360bbb-3(b)(1), unless the authorization is terminated or revoked sooner. Performed at [facility], [place] [address], [place], [place] [address]       Radiology Studies: CT Angio Chest PE W and/or Wo Contrast  Result Date: 08/31/2019 CLINICAL DATA:  Hypoxia on home oxygen. More confused and normal. Recent COVID-19 exposure. Elevated D-dimer. Evaluate for pulmonary embolism. EXAM: CT ANGIOGRAPHY CHEST WITH CONTRAST TECHNIQUE: Multidetector CT imaging of the chest was performed using the standard protocol during bolus administration of intravenous contrast. Multiplanar CT image reconstructions and MIPs were obtained to evaluate the vascular anatomy. CONTRAST:  11mL OMNIPAQUE IOHEXOL 350 MG/ML SOLN COMPARISON:  09/13/2013 FINDINGS: Cardiovascular: Heart is normal in size. Calcified plaque is present over the left main and 3 vessel coronary arteries. There is mild calcified plaque over the  thoracic aorta. Thoracic aorta is normal caliber without dissection or aneurysm. Pulmonary arterial system is well opacified without evidence of emboli. Remaining vascular structures are unremarkable. Mediastinum/Nodes: 0.1 cm AP window lymph node reactive. No other significant mediastinal or hilar adenopathy. Remaining mediastinal structures are normal. Lungs/Pleura: Lungs are adequately inflated with moderate centrilobular emphysematous disease. There is mild posterior dependent bibasilar atelectasis left worse than right. No evidence of effusion. No lobar consolidation. Airways are unremarkable. Upper Abdomen: Calcified plaque over the abdominal aorta. There are a few small hypodensities over the liver unchanged likely cysts. Subtle cystic change over the upper pole left kidney unchanged. Stable 1.6 cm left adrenal nodule likely adenoma. Musculoskeletal: Degenerative change of the spine. Review of the MIP images confirms the above findings. IMPRESSION: 1. No evidence of pulmonary emboli. No acute cardiopulmonary disease. Minimal dependent bibasilar atelectasis. 2. Aortic Atherosclerosis (ICD10-I70.0) and Emphysema (ICD10-J43.9). Atherosclerotic coronary artery disease. 3. Few small liver hypodensities unchanged likely cysts. Minimal cystic change over the upper pole left kidney unchanged. 1.6 cm left adrenal nodule unchanged likely an adenoma. Electronically Signed   By: Marin Olp M.D.   On: 08/31/2019 16:24   DG Chest Portable 1 View  Result Date: 08/31/2019 CLINICAL DATA:  Shortness of breath, altered mental status, hypoxia EXAM: PORTABLE CHEST 1 VIEW COMPARISON:  Portable exam 1346 hours compared to 10/08/2018 FINDINGS: Upper normal size of cardiac silhouette post CABG. Atherosclerotic calcification aorta. Mediastinal contours and pulmonary vascularity otherwise normal. Emphysematous and minimal bronchitic consistent with COPD. Bibasilar interstitial infiltrates, chronic unchanged since 10/08/2018.  Upper lungs clear. No pleural effusion or pneumothorax. IMPRESSION: Borderline enlargement of cardiac silhouette post CABG. COPD changes with bibasilar interstitial infiltrates; these could represent chronic interstitial disease or recurrent bibasilar pneumonia. Electronically Signed   By: Lavonia Dana M.D.   On: 08/31/2019 14:15  LOS: 1 day   [employer] on [email or web address]  09/02/2019, 10:30 AM

## 2019-09-03 ENCOUNTER — Other Ambulatory Visit: Payer: Self-pay

## 2019-09-03 ENCOUNTER — Inpatient Hospital Stay (HOSPITAL_COMMUNITY): Payer: Medicare HMO

## 2019-09-03 ENCOUNTER — Encounter (HOSPITAL_COMMUNITY): Payer: Self-pay | Admitting: Internal Medicine

## 2019-09-03 LAB — C-REACTIVE PROTEIN: CRP: 6.1 mg/dL — ABNORMAL HIGH (ref <1.0)

## 2019-09-03 LAB — COMPREHENSIVE METABOLIC PANEL
ALT: 17 U/L (ref 0–44)
AST: 17 U/L (ref 15–41)
Albumin: 2.8 g/dL — ABNORMAL LOW (ref 3.5–5.0)
Alkaline Phosphatase: 46 U/L (ref 38–126)
Anion gap: 10 (ref 5–15)
BUN: 46 mg/dL — ABNORMAL HIGH (ref 8–23)
CO2: 26 mmol/L (ref 22–32)
Calcium: 8.2 mg/dL — ABNORMAL LOW (ref 8.9–10.3)
Chloride: 106 mmol/L (ref 98–111)
Creatinine, Ser: 1.27 mg/dL — ABNORMAL HIGH (ref 0.61–1.24)
GFR calc Af Amer: >60 mL/min (ref >60)
GFR calc non Af Amer: 52 mL/min — ABNORMAL LOW (ref >60)
Glucose, Bld: 150 mg/dL — ABNORMAL HIGH (ref 70–99)
Potassium: 4.1 mmol/L (ref 3.5–5.1)
Sodium: 142 mmol/L (ref 135–145)
Total Bilirubin: 0.7 mg/dL (ref 0.3–1.2)
Total Protein: 6.2 g/dL — ABNORMAL LOW (ref 6.5–8.1)

## 2019-09-03 LAB — CBC
HCT: 41.9 % (ref 39.0–52.0)
Hemoglobin: 13.1 g/dL (ref 13.0–17.0)
MCH: 31.6 pg (ref 26.0–34.0)
MCHC: 31.3 g/dL (ref 30.0–36.0)
MCV: 101 fL — ABNORMAL HIGH (ref 80.0–100.0)
Platelets: 193 10*3/uL (ref 150–400)
RBC: 4.15 MIL/uL — ABNORMAL LOW (ref 4.22–5.81)
RDW: 13.7 % (ref 11.5–15.5)
WBC: 10.4 10*3/uL (ref 4.0–10.5)
nRBC: 0 % (ref 0.0–0.2)

## 2019-09-03 LAB — D-DIMER, QUANTITATIVE: D-Dimer, Quant: 3.42 ug/mL-FEU — ABNORMAL HIGH (ref 0.00–0.50)

## 2019-09-03 LAB — FERRITIN: Ferritin: 393 ng/mL — ABNORMAL HIGH (ref 24–336)

## 2019-09-03 MED ORDER — HALOPERIDOL LACTATE 5 MG/ML IJ SOLN: 2.0000 mg | Freq: Four times a day (QID) | INTRAMUSCULAR | Status: DC | PRN

## 2019-09-03 MED ORDER — QUETIAPINE FUMARATE 50 MG PO TABS: 50.0000 mg | ORAL_TABLET | Freq: Every day | ORAL | Status: DC

## 2019-09-03 NOTE — Progress Notes (Signed)
Rapid Response Event Note  Overview: Called to bedside by [facility]. Pt fell at shift change per [facility] report and had a CT done. Pt seems more lethargic at this time per staff. At CT he was "feisty" and now he is resting in bed talking without slurring words.       Initial Focused Assessment: Pt oriented X1. Only oriented to self and birthday which is Baseline. NIH complete.Pt does not remember that he fell.   Interventions: MD paged.  Plan of Care (if not transferred): MD wants to await CT results  Event Summary:   at      at          Margaret Pyle

## 2019-09-03 NOTE — Progress Notes (Signed)
PROGRESS NOTE  Alan Ortega [id number] DOB: 06/27/1934 DOA: 09/01/2019  PCP: [name], MD  Brief History/Interval Summary: 85 y.o. male with a past medical history of dementia, coronary artery disease, history of COPD (unclear if he is on home oxygen or not), history of essential hypertension, chronic systolic CHF.  Patient is a poor historian due to his dementia.  Most of the history was obtained from the ER physician notes as well as the hospitalist H&P done at [facility]  Apparently patient had exposure to Covid 19 at end of November.  He initially tested negative for the virus.  He started having symptoms of viral infections and patient's primary care physician ordered Z-Pak and steroids.  Subsequently patient started having fever and chills.  Started feeling a little short of breath.  He was also noted to be more confused at home.  He was noted to be hypoxic.  He was noted to be positive for COVID-19 in the emergency department.  Chest x-ray was also suggestive of bilateral opacities.  He was subsequently hospitalized for further management.  He was transferred to [facility].   Reason for Visit: Pneumonia due to COVID-19.  Acute respiratory failure with hypoxia.  Consultants: None  Procedures: None  Antibiotics: Anti-infectives (From admission, onward)   Start     Dose/Rate Route Frequency Ordered Stop   09/02/19 1000  remdesivir 100 mg in sodium chloride 0.9 % 100 mL IVPB     100 mg 200 mL/hr over 30 Minutes Intravenous Daily 09/01/19 1917 09/05/19 0959   09/02/19 0830  cefTRIAXone (ROCEPHIN) 1 g in sodium chloride 0.9 % 100 mL IVPB     1 g 200 mL/hr over 30 Minutes Intravenous Every 24 hours 09/02/19 0739 2019-09-09 0829      Subjective/Interval History: Patient awake alert this morning.  Distracted and mildly confused.  Denies any discomfort.  States that his shortness of breath has improved.    Assessment/Plan:  Acute  Hypoxic Resp. Failure/Pneumonia due to COVID-19  COVID-19 Labs  Lab Results  Component Value Date   SARSCOV2NAA POSITIVE (A) 08/31/2019   [place] Not Detected 08/26/2019     Recent Labs  Lab 08/31/19 1344 09/01/19 1935 09/03/19 0116  DDIMER  --  6.33* 3.42*  FERRITIN  --  257 393*  CRP  --  8.1* 6.1*  ALT 11 15 17   PROCALCITON  --  0.34  --     Objective findings: Fever: Afebrile Oxygen requirements: Nasal cannula.  3 L/min.  Saturating in the early 90s.    COVID 19 Therapeutics: Antibacterials: Ceftriaxone day 2 today Remdesivir: Day 4 today Steroids: Dexamethasone 6 mg q12h Diuretics: Not on a scheduled basis here in the hospital Actemra: Not given yet Convalescent Plasma: Not given yet Vitamin C and Zinc: Continue PUD Prophylaxis: Pepcid DVT Prophylaxis: Subcutaneous heparin  Patient seems to be stable from a respiratory standpoint.  He remains on remdesivir and steroids.  CT angiogram done at the time of admission did not show any PE.  D-dimer improved to 3.42.  CRP is also improved at 6.1 today.  Continue to wean down oxygen.  Incentive spirometry, mobilization, prone positioning as much as possible.  Abnormal UA/suspected UTI UA was noted to be abnormal.  Due to his dementia it was difficult to ascertain if the patient was symptomatic or not.  His procalcitonin was minimally elevated at 0.34.  Patient started on ceftriaxone for 5 days.    History of COPD/Chronic respiratory failure  with hypoxia Patient uses oxygen at home at 2-1/2 L/min.  Stable from a COPD standpoint.    Chronic systolic CHF Based on echocardiogram done in January 2020 his EF is 30 to 35%.  He was given intravenous Lasix while he was in [facility].  Volume status remained stable.  Renal function has improved this morning.  He is on Lasix 40 mg daily at home.  Continue to monitor ins and outs and daily weights.  Continue to hold furosemide for another day.    Coronary artery disease status post  CABG Stable.  Continue aspirin beta-blocker.  Hypokalemia Potassium is improved and is normal.  Magnesium was 2.0.    Acute Kidney injury Creatinine was 1.2 earlier this year.  Creatinine was 1.46 at admission.  Improved to 1.27 today.  Monitor urine output.  Likely has some element of chronic kidney disease, unknown stage.    DVT Prophylaxis: Subcutaneous heparin Code Status: DNR Family Communication: Daughter was updated yesterday.  We will call her again today. Disposition Plan: Patient lives in his own house but he has 24/7 caregivers at home per his daughter.  Anticipate he will be able to return home to his usual setting.   Medications:  Scheduled: . aspirin EC  81 mg Oral QHS  . dexamethasone (DECADRON) injection  6 mg Intravenous Q12H  . docusate sodium  100 mg Oral BID  . escitalopram  10 mg Oral QHS  . gabapentin  100 mg Oral BID  . heparin  5,000 Units Subcutaneous Q8H  . mouth rinse  15 mL Mouth Rinse BID  . metoprolol succinate  25 mg Oral Daily  . multivitamin with minerals  1 tablet Oral Daily  . QUEtiapine  25 mg Oral QHS  . simvastatin  20 mg Oral QHS  . vitamin C  500 mg Oral Daily  . zinc sulfate  220 mg Oral Daily   Continuous: . cefTRIAXone (ROCEPHIN)  IV 1 g (09/03/19 0907)  . famotidine (PEPCID) IV 20 mg (09/03/19 1223)  . remdesivir 100 mg in NS 100 mL 100 mg (09/03/19 1025)   [id number], albuterol, chlorpheniramine-HYDROcodone, guaiFENesin-dextromethorphan, hydrALAZINE, ondansetron **OR** ondansetron (ZOFRAN) IV   Objective:  Vital Signs  Vitals:   09/02/19 1619 09/02/19 2000 09/03/19 0400 09/03/19 0752  BP: (!) 116/95 (!) 145/100 121/85 133/84  Pulse:  (!) 59 94 60  Resp:  19 (!) 21 18  Temp:  97.7 F (36.5 C) (!) 97.5 F (36.4 C) 98 F (36.7 C)  TempSrc:  Oral Oral Oral  SpO2:  93% 96% 96%    Intake/Output Summary (Last 24 hours) at 09/03/2019 1414 Last data filed at 09/03/2019 0500 Gross per 24 hour  Intake --  Output 650  ml  Net -650 ml   There were no vitals filed for this visit.  General appearance: Awake alert.  Pleasantly confused. Resp: Coarse breath sounds bilaterally.  Crackles at the bases bilaterally.  No wheezing or rhonchi.  Mildly tachypneic. Cardio: S1-S2 is normal regular.  No S3-S4.  No rubs murmurs or bruit GI: Abdomen is soft.  Nontender nondistended.  Bowel sounds are present normal.  No masses organomegaly Extremities: No edema.   Neurologic: Pleasantly confused.  No obvious focal neurological deficits.    Lab Results:  Data Reviewed: I have personally reviewed following labs and imaging studies  CBC: Recent Labs  Lab 08/31/19 1344 09/01/19 1935 09/03/19 0116  WBC 13.3* 12.0* 10.4  NEUTROABS 11.1* 9.2*  --   HGB 13.9 13.9 13.1  HCT 42.2 43.7 41.9  MCV 97.5 101.6* 101.0*  PLT 217 201 0000000    Basic Metabolic Panel: Recent Labs  Lab 08/31/19 1344 09/01/19 1935 09/03/19 0116  NA 142 143 142  K 2.8* 4.0 4.1  CL 104 102 106  CO2 28 31 26   GLUCOSE 94 109* 150*  BUN 25* 30* 46*  CREATININE 1.46* 1.44* 1.27*  CALCIUM 7.7* 7.9* 8.2*  MG  --  2.0  --     GFR: Estimated Creatinine Clearance: 40.5 mL/min (A) (by C-G formula based on SCr of 1.27 mg/dL (H)).  Liver Function Tests: Recent Labs  Lab 08/31/19 1344 09/01/19 1935 09/03/19 0116  AST 16 17 17   ALT 11 15 17   ALKPHOS 52 51 46  BILITOT 1.1 0.4 0.7  PROT 6.8 6.8 6.2*  ALBUMIN 3.2* 3.1* 2.8*    Anemia Panel: Recent Labs    09/01/19 1935 09/03/19 0116  FERRITIN 257 393*    Recent Results (from the past 240 hour(s))  Novel Coronavirus, NAA ([facility])     Status: None   Collection Time: 08/26/19  1:05 PM   Specimen: Nasopharyngeal(NP) swabs in vial transport medium   NASOPHARYNGE  TESTING  Result Value Ref Range Status   SARS-CoV-2, NAA Not Detected Not Detected Final    Comment: This nucleic acid amplification test was developed and its performance characteristics determined by [employer]. Nucleic acid amplification tests include PCR and TMA. This test has not been FDA cleared or approved. This test has been authorized by FDA under an Emergency Use Authorization (EUA). This test is only authorized for the duration of time the declaration that circumstances exist justifying the authorization of the emergency use of in vitro diagnostic tests for detection of SARS-CoV-2 virus and/or diagnosis of COVID-19 infection under section 564(b)(1) of the Act, 21 U.S.C. [id number]) (1), unless the authorization is terminated or revoked sooner. When diagnostic testing is negative, the possibility of a false negative result should be considered in the context of a patient's recent exposures and the presence of clinical signs and symptoms consistent with COVID-19. An individual without symptoms of COVID-19 and who is not shedding SARS-CoV-2 virus would  expect to have a negative (not detected) result in this assay.   Blood culture (routine x 2)     Status: None (Preliminary result)   Collection Time: 08/31/19  1:44 PM   Specimen: BLOOD  Result Value Ref Range Status   Specimen Description BLOOD BLOOD LEFT FOREARM  Final   Special Requests   Final    BOTTLES DRAWN AEROBIC AND ANAEROBIC Blood Culture adequate volume   Culture   Final    NO GROWTH 3 DAYS Performed at [facility], [address] [place], [place] [address]    Report Status PENDING  Incomplete  Blood culture (routine x 2)     Status: None (Preliminary result)   Collection Time: 08/31/19  2:26 PM   Specimen: BLOOD  Result Value Ref Range Status   Specimen Description BLOOD BLOOD RIGHT HAND  Final   Special Requests   Final    BOTTLES DRAWN AEROBIC AND ANAEROBIC Blood Culture adequate volume   Culture   Final    NO GROWTH 3 DAYS Performed at [facility], [place], [place] [address]    Report Status PENDING  Incomplete  SARS CORONAVIRUS 2 (TAT 6-24 HRS) Nasopharyngeal  Nasopharyngeal Swab     Status: Abnormal   Collection Time: 08/31/19  2:26 PM  Specimen: Nasopharyngeal Swab  Result Value Ref Range Status   SARS Coronavirus 2 POSITIVE (A) NEGATIVE Final    Comment: RESULT CALLED TO, READ BACK BY AND VERIFIED WITH: Juliane Lack RN 9:35 09/01/19 ([email or web address]) (NOTE) SARS-CoV-2 target nucleic acids are DETECTED. The SARS-CoV-2 RNA is generally detectable in upper and lower respiratory specimens during the acute phase of infection. Positive results are indicative of the presence of SARS-CoV-2 RNA. Clinical correlation with patient history and other diagnostic information is  necessary to determine patient infection status. Positive results do not rule out bacterial infection or co-infection with other viruses.  The expected result is Negative. Fact Sheet for Patients: SugarRoll.be Fact Sheet for Healthcare Providers: https://www.woods-mathews.com/ This test is not yet approved or cleared by the [place] FDA and  has been authorized for detection and/or diagnosis of SARS-CoV-2 by FDA under an Emergency Use Authorization (EUA). This EUA will remain  in effect (meaning this test can be used) for the  duration of the COVID-19 declaration under Section 564(b)(1) of the Act, 21 U.S.C. section 360bbb-3(b)(1), unless the authorization is terminated or revoked sooner. Performed at [facility], [place] [address], [place], [place] [address]       Radiology Studies: No results found.     LOS: 2 days   [place] [facility] Pager on [email or web address]  09/03/2019, 2:14 PM

## 2019-09-03 NOTE — Plan of Care (Signed)
  Problem: Education: Goal: Knowledge of General Education information will improve Description: Including pain rating scale, medication(s)/side effects and non-pharmacologic comfort measures Outcome: Progressing   Problem: Health Behavior/Discharge Planning: Goal: Ability to manage health-related needs will improve Outcome: Progressing   Problem: Clinical Measurements: Goal: Ability to maintain clinical measurements within normal limits will improve Outcome: Progressing Goal: Will remain free from infection Outcome: Progressing Goal: Diagnostic test results will improve Outcome: Progressing Goal: Respiratory complications will improve Outcome: Progressing Goal: Cardiovascular complication will be avoided Outcome: Progressing   Problem: Activity: Goal: Risk for activity intolerance will decrease Outcome: Progressing   Problem: Nutrition: Goal: Adequate nutrition will be maintained Outcome: Progressing   Problem: Coping: Goal: Level of anxiety will decrease Outcome: Progressing   Problem: Elimination: Goal: Will not experience complications related to bowel motility Outcome: Progressing Goal: Will not experience complications related to urinary retention Outcome: Progressing   Problem: Pain Managment: Goal: General experience of comfort will improve Outcome: Progressing   Problem: Safety: Goal: Ability to remain free from injury will improve Outcome: Progressing   Problem: Skin Integrity: Goal: Risk for impaired skin integrity will decrease Outcome: Progressing   Problem: Coping: Goal: Psychosocial and spiritual needs will be supported Outcome: Progressing   Problem: Respiratory: Goal: Will maintain a patent airway Outcome: Progressing Goal: Complications related to the disease process, condition or treatment will be avoided or minimized Outcome: Progressing   

## 2019-09-03 NOTE — Significant Event (Addendum)
At the beginning of the shift I was informed that patient had fallen.  Nursing staff had the bed alarm, immediately went to the patient's room, unfortunately by the time they arrived patient had fallen.  CT head was obtained, it shows right temporal lobe subdural hematoma measuring approximately 5 mm thickness, subdural gas presumably related to fracture through the sinus.  Discussed the findings with the patient's daughter Ms Alan Ortega [id number]), discussed options including surgical versus nonsurgical management.  Given the patient's age and comorbidities the daughter is leaning towards nonsurgical management.  She is going to discuss with family and home back.  In the meanwhile she has opted for conservative management, repeat CT in 4 hours to assess progression of bleed.  Reaffirmed patient is DNR.  09/04/2019 03:28 Addendum Received a call from radiologist regarding repeat CT head, it shows "increased intraparenchymal hematoma within the anterior temporal lobe 1.9 x 1.8 cm, previously 0.8 x 0.7 cm, Surrounding subarachnoid blood is unchanged. The right convexity subdural hematoma is unchanged in thickness again measuring 5 mm. The amount of subarachnoid blood within the basal cisterns has increased. Small amount of pneumocephalus along the right convexity is unchanged."  Patient is more lethargic, complained about headache and vomited on the CT table.  The signs indicate likely rising intracranial pressure.  Case discussed with the patient's daughter, informed of the findings, his current clinical state and prognosis.  If the bleeding continues this is a terminal event for Mr. Alan Ortega.  He will likely die from brain herniation within the next 24 to 48 hours.  Given his poor prognosis the daughter wishes for him to be comfortable.  Will initiate comfort care measures.

## 2019-09-03 NOTE — Progress Notes (Signed)
See MD note regarding pt's fall at shift change and communication with family. Pt is currently resting comfortably, awakens to voice. Rapid response completed neuro assessment and NIH, see flowsheets. Will repeat CT at 0100 per order. Bed in lowest position with bed alarm on most sensitive setting. Continue to monitor. Hortencia Conradi RN

## 2019-09-03 NOTE — Progress Notes (Signed)
Called patient's grandson Roderic Palau twice as listed in patients contacts, but was unable to get a hold of him. I left him a voicemail encouraging him to call us back for an update on the patient's condition.

## 2019-09-04 ENCOUNTER — Inpatient Hospital Stay (HOSPITAL_COMMUNITY): Payer: Medicare HMO

## 2019-09-04 DIAGNOSIS — I629 Nontraumatic intracranial hemorrhage, unspecified: Secondary | ICD-10-CM

## 2019-09-04 MED ORDER — MORPHINE SULFATE (PF) 2 MG/ML IV SOLN
2.0000 mg | INTRAVENOUS | Status: DC | PRN
  Administered 2019-09-04 (×2): 2 mg via INTRAVENOUS
  Filled 2019-09-04 (×2): qty 1

## 2019-09-04 MED ORDER — LORAZEPAM 2 MG/ML PO CONC: 1.0000 mg | ORAL | Status: DC | PRN

## 2019-09-04 MED ORDER — LORAZEPAM 2 MG/ML IJ SOLN: 1.0000 mg | INTRAMUSCULAR | Status: DC | PRN

## 2019-09-04 MED ORDER — ACETAMINOPHEN 325 MG PO TABS: 650.0000 mg | ORAL_TABLET | Freq: Four times a day (QID) | ORAL | Status: DC | PRN

## 2019-09-04 MED ORDER — ONDANSETRON 4 MG PO TBDP: 4.0000 mg | ORAL_TABLET | Freq: Four times a day (QID) | ORAL | Status: DC | PRN

## 2019-09-04 MED ORDER — GLYCOPYRROLATE 0.2 MG/ML IJ SOLN: 0.2000 mg | INTRAMUSCULAR | Status: DC | PRN

## 2019-09-04 MED ORDER — HALOPERIDOL LACTATE 2 MG/ML PO CONC: 0.5000 mg | ORAL | Status: DC | PRN

## 2019-09-04 MED ORDER — ONDANSETRON HCL 4 MG/2ML IJ SOLN: 4.0000 mg | Freq: Four times a day (QID) | INTRAMUSCULAR | Status: DC | PRN

## 2019-09-04 MED ORDER — MORPHINE SULFATE (PF) 2 MG/ML IV SOLN
1.0000 mg | INTRAVENOUS | Status: DC | PRN
  Administered 2019-09-04 (×2): 1 mg via INTRAVENOUS
  Filled 2019-09-04 (×2): qty 1

## 2019-09-04 MED ORDER — HALOPERIDOL 0.5 MG PO TABS: 0.5000 mg | ORAL_TABLET | ORAL | Status: DC | PRN

## 2019-09-04 MED ORDER — ACETAMINOPHEN 650 MG RE SUPP: 650.0000 mg | Freq: Four times a day (QID) | RECTAL | Status: DC | PRN

## 2019-09-04 MED ORDER — LORAZEPAM 0.5 MG PO TABS: 1.0000 mg | ORAL_TABLET | ORAL | Status: DC | PRN

## 2019-09-04 MED ORDER — HALOPERIDOL LACTATE 5 MG/ML IJ SOLN
0.5000 mg | INTRAMUSCULAR | Status: DC | PRN
  Administered 2019-09-04: 17:00:00 0.5 mg via INTRAVENOUS
  Filled 2019-09-04: qty 1

## 2019-09-04 MED ORDER — LORAZEPAM 2 MG/ML IJ SOLN
1.0000 mg | INTRAMUSCULAR | Status: DC | PRN
  Administered 2019-09-04: 1 mg via INTRAVENOUS
  Filled 2019-09-04: qty 1

## 2019-09-04 MED ORDER — MORPHINE SULFATE (PF) 2 MG/ML IV SOLN
2.0000 mg | INTRAVENOUS | Status: DC | PRN
  Administered 2019-09-04 – 2019-09-05 (×2): 2 mg via INTRAVENOUS
  Filled 2019-09-04 (×2): qty 1

## 2019-09-04 MED ORDER — GLYCOPYRROLATE 1 MG PO TABS: 1.0000 mg | ORAL_TABLET | ORAL | Status: DC | PRN

## 2019-09-04 NOTE — Progress Notes (Signed)
RN updated daughter. Emotional support given. No further questions.

## 2019-09-04 NOTE — Progress Notes (Signed)
Placed condom cath on patient. Tolerated well

## 2019-09-04 NOTE — Plan of Care (Signed)
  Problem: Clinical Measurements: Goal: Cardiovascular complication will be avoided Outcome: Progressing   Problem: Coping: Goal: Level of anxiety will decrease Outcome: Progressing   Problem: Elimination: Goal: Will not experience complications related to bowel motility Outcome: Progressing   Problem: Pain Managment: Goal: General experience of comfort will improve Outcome: Progressing   Problem: Skin Integrity: Goal: Risk for impaired skin integrity will decrease Outcome: Progressing   Problem: Coping: Goal: Psychosocial and spiritual needs will be supported Outcome: Progressing   Problem: Respiratory: Goal: Will maintain a patent airway Outcome: Progressing

## 2019-09-04 NOTE — Progress Notes (Signed)
Updated patient's daughter Arrie Aran on patient status. Dawn happy for the update and knows patient is on comfort care. Will call her again towards end of shift as requested.

## 2019-09-04 NOTE — Progress Notes (Signed)
Patient being transferred to room 311. Called and gave report to Donato Schultz RN. Will call and notify patient's daughter of transfer.

## 2019-09-04 NOTE — Progress Notes (Signed)
Pt taken for repeat CT. He is much more lethargic, doesn't open his eyes and repeats the word "quit". Follows commands inconsistently. Pt vomited while on the CT table. Yaunker suction used to suction emesis from oral cavity. There was blood present in the emesis. MD Vanita Ingles made aware of all of these findings. He is waiting for the CT report to come through and will update the family. Hortencia Conradi RN

## 2019-09-04 NOTE — Progress Notes (Signed)
Patient's bed alarm went off. RN Rasheen and myself went into patient's room and found that patient had climbed over bed rails and was lying on the floor. On appearance, patient had abrasion to left eye brow and bruise on left shoulder. Patient appeared lethargic and was slow to answering questions. We put patient back in bed and did vital signs, reconnected him to O2 monitor and placed nasal cannula back on patient. Patient slowly became more alert, but seemed more tired than usual. Patient's MD notified and CT head ordered. Patient's family notified as well.

## 2019-09-04 NOTE — Progress Notes (Signed)
PROGRESS NOTE  Alan Ortega [id number] DOB: Dec 05, 1933 DOA: 09/01/2019  PCP: [name], MD  Brief History/Interval Summary: 85 y.o. male with a past medical history of dementia, coronary artery disease, history of COPD (unclear if he is on home oxygen or not), history of essential hypertension, chronic systolic CHF.  Patient is a poor historian due to his dementia.  Most of the history was obtained from the ER physician notes as well as the hospitalist H&P done at [facility].  Apparently patient had exposure to Covid 19 at end of November.  He initially tested negative for the virus.  He started having symptoms of viral infections and patient's primary care physician ordered Z-Pak and steroids.  Subsequently patient started having fever and chills.  Started feeling a little short of breath.  He was also noted to be more confused at home.  He was noted to be hypoxic.  He was noted to be positive for COVID-19 in the emergency department.  Chest x-ray was also suggestive of bilateral opacities.  He was subsequently hospitalized for further management.  He was transferred to [facility].   Reason for Visit: Pneumonia due to COVID-19.  Acute respiratory failure with hypoxia.  Consultants: None  Procedures: None  Antibiotics: Anti-infectives (From admission, onward)   Start     Dose/Rate Route Frequency Ordered Stop   09/02/19 1000  remdesivir 100 mg in sodium chloride 0.9 % 100 mL IVPB     100 mg 200 mL/hr over 30 Minutes Intravenous Daily 09/01/19 1917 09/05/19 0959   09/02/19 0830  cefTRIAXone (ROCEPHIN) 1 g in sodium chloride 0.9 % 100 mL IVPB     1 g 200 mL/hr over 30 Minutes Intravenous Every 24 hours 09/02/19 0739 2019-10-03 0829      Subjective/Interval History: Overnight events noted.  Patient now with intracranial hemorrhage most likely as a result of his fall.  He opens his eyes but does not answer any questions this morning.  Seems to  be delirious.      Assessment/Plan:  Acute Hypoxic Resp. Failure/Pneumonia due to COVID-19  COVID-19 Labs  Lab Results  Component Value Date   SARSCOV2NAA POSITIVE (A) 08/31/2019   [place] Not Detected 08/26/2019     Recent Labs  Lab 08/31/19 1344 09/01/19 1935 09/03/19 0116  DDIMER  --  6.33* 3.42*  FERRITIN  --  257 393*  CRP  --  8.1* 6.1*  ALT 11 15 17   PROCALCITON  --  0.34  --    From a respiratory standpoint patient is stable.  He is saturating in the 90s on 2 L of oxygen by nasal cannula which is his usual home oxygen requirement.  Patient was started on remdesivir and steroids for his COVID-19.  He was not given Actemra or convalescent plasma.  His inflammatory markers were improving.  D-dimer had also improved.  CT angiogram done at the time of admission did not show any PE.  Now transition to comfort care.  Acute intracranial hemorrhage secondary to fall Yesterday evening patient went over the railing of his bed and dropped to the floor.  He was noted to have bruising on the left side of his face.  A CT scan was done which showed cranial hemorrhage with subdural hematoma as well as subarachnoid hemorrhage.  CT scan was repeated after few hours which showed worsening hemorrhage.  Discussions held with family member.  Patient transition to comfort care.  Abnormal UA/suspected UTI UA was noted to  be abnormal.  Due to his dementia it was difficult to ascertain if the patient was symptomatic or not.  His procalcitonin was minimally elevated at 0.34.  Patient started on ceftriaxone.  Now comfort care.  History of COPD/Chronic respiratory failure with hypoxia Patient uses oxygen at home at 2-1/2 L/min.   Chronic systolic CHF Based on echocardiogram done in January 2020 his EF is 30 to 35%.  He was given intravenous Lasix while he was in [facility].     Coronary artery disease status post CABG Stable.  Continue aspirin beta-blocker.  Hypokalemia Was repleted  Acute  Kidney injury Creatinine was 1.2 earlier this year.  Creatinine was 1.46 at admission.  Improved to 1.27.  Monitor urine output.  Likely has some element of chronic kidney disease, unknown stage.    DVT Prophylaxis: Comfort care. Code Status: DNR Family Communication: Daughter being updated daily. Disposition Plan: Patient lives in his own house but he has 24/7 caregivers at home per his daughter.  We will need to see how he does over the next 24 to 48 hours.   Medications:  Scheduled: . aspirin EC  81 mg Oral QHS  . dexamethasone (DECADRON) injection  6 mg Intravenous Q12H  . docusate sodium  100 mg Oral BID  . escitalopram  10 mg Oral QHS  . gabapentin  100 mg Oral BID  . heparin  5,000 Units Subcutaneous Q8H  . mouth rinse  15 mL Mouth Rinse BID  . metoprolol succinate  25 mg Oral Daily  . multivitamin with minerals  1 tablet Oral Daily  . QUEtiapine  50 mg Oral QHS  . simvastatin  20 mg Oral QHS  . vitamin C  500 mg Oral Daily  . zinc sulfate  220 mg Oral Daily   Continuous: . cefTRIAXone (ROCEPHIN)  IV Stopped (09/03/19 1803)  . famotidine (PEPCID) IV 20 mg (09/03/19 1223)  . remdesivir 100 mg in NS 100 mL Stopped (09/03/19 1827)   [id number] **OR** acetaminophen, albuterol, chlorpheniramine-HYDROcodone, glycopyrrolate **OR** glycopyrrolate **OR** glycopyrrolate, guaiFENesin-dextromethorphan, haloperidol **OR** haloperidol **OR** haloperidol lactate, hydrALAZINE, LORazepam **OR** LORazepam **OR** LORazepam, morphine injection, ondansetron **OR** ondansetron (ZOFRAN) IV   Objective:  Vital Signs  Vitals:   09/04/19 0334 09/04/19 0338 09/04/19 0400 09/04/19 0711  BP:  (!) 147/110  (!) 121/101  Pulse: 78 (!) 108 70 80  Resp: 20 19 20    Temp: 98.6 F (37 C)   98.2 F (36.8 C)  TempSrc: Oral   Oral  SpO2: 95% 96% 94% 92%  Weight:      Height:       No intake or output data in the 24 hours ending 09/04/19 1018 Filed Weights   09/03/19 1646  Weight: 65 kg     General appearance: Not responding this morning Resp: Coarse breath sounds bilaterally.  Crackles at the bases.  No wheezing or rhonchi. Cardio: S1-S2 is normal regular.  No S3-S4.  No rubs murmurs or bruit GI: Abdomen is soft.  Nontender nondistended.  Bowel sounds are present normal.  No masses organomegaly Neurologic: Not responding much.  Does open his eyes.    Lab Results:  Data Reviewed: I have personally reviewed following labs and imaging studies  CBC: Recent Labs  Lab 08/31/19 1344 09/01/19 1935 09/03/19 0116  WBC 13.3* 12.0* 10.4  NEUTROABS 11.1* 9.2*  --   HGB 13.9 13.9 13.1  HCT 42.2 43.7 41.9  MCV 97.5 101.6* 101.0*  PLT 217 201 0000000    Basic Metabolic Panel:  Recent Labs  Lab 08/31/19 1344 09/01/19 1935 09/03/19 0116  NA 142 143 142  K 2.8* 4.0 4.1  CL 104 102 106  CO2 28 31 26   GLUCOSE 94 109* 150*  BUN 25* 30* 46*  CREATININE 1.46* 1.44* 1.27*  CALCIUM 7.7* 7.9* 8.2*  MG  --  2.0  --     GFR: Estimated Creatinine Clearance: 40.5 mL/min (A) (by C-G formula based on SCr of 1.27 mg/dL (H)).  Liver Function Tests: Recent Labs  Lab 08/31/19 1344 09/01/19 1935 09/03/19 0116  AST 16 17 17   ALT 11 15 17   ALKPHOS 52 51 46  BILITOT 1.1 0.4 0.7  PROT 6.8 6.8 6.2*  ALBUMIN 3.2* 3.1* 2.8*    Anemia Panel: Recent Labs    09/01/19 1935 09/03/19 0116  FERRITIN 257 393*    Recent Results (from the past 240 hour(s))  Novel Coronavirus, NAA ([facility])     Status: None   Collection Time: 08/26/19  1:05 PM   Specimen: Nasopharyngeal(NP) swabs in vial transport medium   NASOPHARYNGE  TESTING  Result Value Ref Range Status   SARS-CoV-2, NAA Not Detected Not Detected Final    Comment: This nucleic acid amplification test was developed and its performance characteristics determined by [employer]. Nucleic acid amplification tests include PCR and TMA. This test has not been FDA cleared or approved. This test has been authorized by FDA  under an Emergency Use Authorization (EUA). This test is only authorized for the duration of time the declaration that circumstances exist justifying the authorization of the emergency use of in vitro diagnostic tests for detection of SARS-CoV-2 virus and/or diagnosis of COVID-19 infection under section 564(b)(1) of the Act, 21 U.S.C. [id number]) (1), unless the authorization is terminated or revoked sooner. When diagnostic testing is negative, the possibility of a false negative result should be considered in the context of a patient's recent exposures and the presence of clinical signs and symptoms consistent with COVID-19. An individual without symptoms of COVID-19 and who is not shedding SARS-CoV-2 virus would  expect to have a negative (not detected) result in this assay.   Blood culture (routine x 2)     Status: None (Preliminary result)   Collection Time: 08/31/19  1:44 PM   Specimen: BLOOD  Result Value Ref Range Status   Specimen Description BLOOD BLOOD LEFT FOREARM  Final   Special Requests   Final    BOTTLES DRAWN AEROBIC AND ANAEROBIC Blood Culture adequate volume   Culture   Final    NO GROWTH 4 DAYS Performed at [facility], [address], [place], [place] [address]    Report Status PENDING  Incomplete  Blood culture (routine x 2)     Status: None (Preliminary result)   Collection Time: 08/31/19  2:26 PM   Specimen: BLOOD  Result Value Ref Range Status   Specimen Description BLOOD BLOOD RIGHT HAND  Final   Special Requests   Final    BOTTLES DRAWN AEROBIC AND ANAEROBIC Blood Culture adequate volume   Culture   Final    NO GROWTH 4 DAYS Performed at [facility], [place], [place] [address]    Report Status PENDING  Incomplete  SARS CORONAVIRUS 2 (TAT 6-24 HRS) Nasopharyngeal Nasopharyngeal Swab     Status: Abnormal   Collection Time: 08/31/19  2:26 PM   Specimen: Nasopharyngeal Swab  Result Value Ref Range Status   SARS  Coronavirus 2 POSITIVE (A) NEGATIVE Final  Comment: RESULT CALLED TO, READ BACK BY AND VERIFIED WITH: Juliane Lack RN 9:35 09/01/19 ([email or web address]) (NOTE) SARS-CoV-2 target nucleic acids are DETECTED. The SARS-CoV-2 RNA is generally detectable in upper and lower respiratory specimens during the acute phase of infection. Positive results are indicative of the presence of SARS-CoV-2 RNA. Clinical correlation with patient history and other diagnostic information is  necessary to determine patient infection status. Positive results do not rule out bacterial infection or co-infection with other viruses.  The expected result is Negative. Fact Sheet for Patients: SugarRoll.be Fact Sheet for Healthcare Providers: https://www.woods-mathews.com/ This test is not yet approved or cleared by the [place] FDA and  has been authorized for detection and/or diagnosis of SARS-CoV-2 by FDA under an Emergency Use Authorization (EUA). This EUA will remain  in effect (meaning this test can be used) for the  duration of the COVID-19 declaration under Section 564(b)(1) of the Act, 21 U.S.C. section 360bbb-3(b)(1), unless the authorization is terminated or revoked sooner. Performed at [facility], [place] [address], [place], [place] [address]       Radiology Studies: CT HEAD WO CONTRAST  Result Date: 09/04/2019 CLINICAL DATA:  Head trauma. Follow-up of subdural hematoma. EXAM: CT HEAD WITHOUT CONTRAST TECHNIQUE: Contiguous axial images were obtained from the base of the skull through the vertex without intravenous contrast. COMPARISON:  Head CT 09/03/2019 at 8:32 p.m. FINDINGS: Brain: Increased size of intraparenchymal hematoma within the anterior right temporal lobe, now measuring 1.9 x 1.8 cm, previously 0.8 x 0.7 cm. Surrounding subarachnoid blood is unchanged. The right convexity subdural hematoma is unchanged in thickness again measuring 5 mm. The amount of  subarachnoid blood within the basal cisterns has increased. Small amount of pneumocephalus along the right convexity is unchanged. 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 No midline shift. Unchanged size and configuration of the ventricles. Vascular: No abnormal hyperdensity of the major intracranial arteries or dural venous sinuses. No intracranial atherosclerosis. Skull: The visualized skull base, calvarium and extracranial soft tissues are normal. Sinuses/Orbits: No fluid levels or advanced mucosal thickening of the visualized paranasal sinuses. No mastoid or middle ear effusion. The orbits are normal. IMPRESSION: 1. Increased size of intraparenchymal hematoma within the anterior right temporal lobe, now measuring 1.9 x 1.8 cm, previously 0.8 x 0.7 cm. 2. Unchanged size of right convexity subdural hematoma. 3. Increased subarachnoid blood within the basal cisterns. These results will be called to the ordering clinician or representative by the Radiologist Assistant, and communication documented in the PACS or zVision Dashboard. Electronically Signed   By: Ulyses Jarred M.D.   On: 09/04/2019 02:13   CT HEAD WO CONTRAST  Result Date: 09/03/2019 CLINICAL DATA:  Unwitnessed fall. Head trauma. EXAM: CT HEAD WITHOUT CONTRAST TECHNIQUE: Contiguous axial images were obtained from the base of the skull through the vertex without intravenous contrast. COMPARISON:  CT head 04/28/2018 FINDINGS: Brain: Right-sided subdural hematoma measuring approximately 5 mm in thickness in the temporal lobe. Associated gas in the subdural space. Subarachnoid hemorrhage also present on the right in the temporal lobe and extending into the basilar cistern on the right. Small amount of subdural hematoma along the tentorium on the right. Small amount of subarachnoid hemorrhage in the left sylvian fissure. Negative for midline shift. Moderate atrophy. Moderate chronic microvascular ischemic change in the white matter  similar to the prior study. No acute infarct or mass Vascular: Negative for hyperdense vessel. Atherosclerotic calcification. Skull: No displaced skull fracture. Subdural gas on the  right is presumably related to a fracture through a sinus. Image quality degraded by motion. Sinuses/Orbits: Mastoid sinus clear bilaterally. Air-fluid level in the sphenoid sinus. Remaining paranasal sinuses clear Other: None IMPRESSION: 1. Right-sided subdural hematoma measuring approximately 5 mm in thickness in the temporal lobe. Subarachnoid hemorrhage in the right temporal lobe and extending into the basilar cistern on the right. Small amount of subdural hematoma along the tentorium on the right. Small amount of subarachnoid hemorrhage in the left Sylvian fissure. 2. Subdural gas on the right is presumably related to a fracture through a sinus. The right temporal bone would be most likely however the mastoid sinuses clear on the right. There is an air-fluid level in the sphenoid sinus however no skull base fracture identified. 3. Atrophy and chronic microvascular ischemia. 4. These results were called by telephone at the time of interpretation on 09/03/2019 at 9:29 pm to provider TRINITY VERA , who verbally acknowledged these results. Electronically Signed   By: Franchot Gallo M.D.   On: 09/03/2019 21:29       LOS: 3 days   [facility] Pager on [email or web address]  09/04/2019, 10:18 AM

## 2019-09-04 NOTE — Progress Notes (Signed)
Patient very lethargic and partially opens eyes to my touch when I rub his chest. He appears to be hugging himself and appears to want to sleep. Unable to follow commands. VS taken this AM.

## 2019-09-05 DIAGNOSIS — Z515 Encounter for palliative care: Secondary | ICD-10-CM

## 2019-09-05 LAB — CULTURE, BLOOD (ROUTINE X 2)
Culture: NO GROWTH
Culture: NO GROWTH
Special Requests: ADEQUATE
Special Requests: ADEQUATE

## 2019-09-05 MED ORDER — LORAZEPAM 2 MG/ML PO CONC: 1.0000 mg | ORAL | 0 refills | Status: AC | PRN

## 2019-09-05 MED ORDER — LORAZEPAM 2 MG/ML PO CONC: 1.0000 mg | ORAL | 0 refills | Status: DC | PRN

## 2019-09-05 MED ORDER — GLYCOPYRROLATE 1 MG PO TABS: 1.0000 mg | ORAL_TABLET | ORAL | 0 refills | Status: AC | PRN

## 2019-09-05 MED ORDER — MORPHINE SULFATE (CONCENTRATE) 20 MG/ML PO SOLN: 5.0000 mg | ORAL | 0 refills | Status: AC | PRN

## 2019-09-05 MED ORDER — HALOPERIDOL LACTATE 2 MG/ML PO CONC: 2.0000 mg | ORAL | 0 refills | Status: AC | PRN

## 2019-09-05 NOTE — Discharge Instructions (Signed)
Hospice Hospice is a service that is designed to provide people who are terminally ill and their families with medical, spiritual, and psychological support. Its aim is to improve your quality of life by keeping you as comfortable as possible in the final stages of life. Who will be my providers when I begin hospice care? Hospice teams often include:  A nurse.  A doctor. The hospice doctor will be available for your care, but you can include your regular doctor or nurse practitioner.  A social worker.  A counselor.  A religious leader (such as a chaplain).  A dietitian.  Therapists.  Trained volunteers who can help with care. What services does hospice provide? Hospice services can vary depending on the center or organization. Generally, they include:  Ways to keep you comfortable, such as: ? Providing care in your home or in a home-like setting. ? Working with your family and friends to help meet your needs. ? Allowing you to enjoy the support of loved ones by receiving much of your basic care from family and friends.  Pain relief and symptom management. The staff will supply all necessary medicines and equipment so that you can stay comfortable and alert enough to enjoy the company of your friends and family.  Visits or care from a nurse and doctor. This may include 24-hour on-call services.  Companionship when you are alone.  Allowing you and your family to rest. Hospice staff may do light housekeeping, prepare meals, and run errands.  Counseling. They will make sure your emotional, spiritual, and social needs are being met, as well as those needs of your family members.  Spiritual care. This will be individualized to meet your needs and your family's needs. It may involve: ? Helping you and your family understand the dying process. ? Helping you say goodbye to your family and friends. ? Performing a specific religious ceremony or ritual.  Massage.  Nutrition  therapy.  Physical and occupational therapy.  Short-term inpatient care, if something cannot be managed in the home.  Art or music therapy.  Bereavement support for grieving family members. When should hospice care begin? Most people who use hospice are believed to have less than 6 months to live.  Your family and health care providers can help you decide when hospice services should begin.  If you live longer than 6 months but your condition does not improve, your doctor may be able to approve you for continued hospice care.  If your condition improves, you may discontinue the program. What should I consider before selecting a program? Most hospice programs are run by nonprofit, independent organizations. Some are affiliated with hospitals, nursing homes, or home health care agencies. Hospice programs can take place in your home or at a hospice center, hospital, or skilled nursing facility. When choosing a hospice program, ask the following questions:  What services are available to me?  What services will be offered to my loved ones?  How involved will my loved ones be?  How involved will my health care provider be?  Who makes up the hospice care team? How are they trained or screened?  How will my pain and symptoms be managed?  If my circumstances change, can the services be provided in a different setting, such as my home or in the hospital?  Is the program reviewed and licensed by the state or certified in some other way?  What does it cost? Is it covered by insurance?  If I choose a hospice   center or nursing home, where is the hospice center located? Is it convenient for family and friends?  If I choose a hospice center or nursing home, can my family and friends visit any time?  Will you provide emotional and spiritual support?  Who can my family call with questions? Where can I learn more about hospice? You can learn about existing hospice programs in your area  from your health care providers. You can also read more about hospice online. The websites of the following organizations have helpful information:  [facility] and Palliative Care Organization [facility]): http://www.brown-buchanan.com/  National Association for [facility]): http://massey-hart.com/  Hospice Foundation of [place]): www.hospicefoundation.org  American Cancer Society (ACS): www.cancer.org  Hospice Net: www.hospicenet.org  Visiting Nurse Associations of [place] (VNAA): www.vnaa.org You may also find more information by contacting the following agencies:  A local agency on aging.  Your local [employer] chapter.  Your state's department of health or social services. Summary  Hospice is a service that is designed to provide people who are terminally ill and their families with medical, spiritual, and psychological support.  Hospice aims to improve your quality of life by keeping you as comfortable as possible in the final stages of life.  Hospice teams often include a doctor, nurse, social worker, counselor, religious leader,dietitian, therapists, and volunteers.  Hospice care generally includes medicine for symptom management, visits from doctors and nurses, physical and occupational therapy, nutrition counseling, spiritual and emotional counseling, caregiver support, and bereavement support for grieving family members.  Hospice programs can take place in your home or at a hospice center, hospital, or skilled nursing facility. This information is not intended to replace advice given to you by your health care provider. Make sure you discuss any questions you have with your health care provider. Document Released: 12/26/2003 Document Revised: 08/21/2017 Document Reviewed: 09/30/2016 Elsevier Patient Education  2020 [employer].

## 2019-09-05 NOTE — Progress Notes (Signed)
Late entry.  Arranged family visit. Daughter and grandson came and saw patient. Support and reassurance given during this rough time.

## 2019-09-05 NOTE — TOC Initial Note (Signed)
Transition of Care [facility]) - Initial/Assessment Note    Patient Details  Name: Alan Ortega MRN: [id number] Date of Birth: 09-Apr-1934  Transition of Care [facility]) CM/SW Contact:    Amador Cunas, LCSW Phone Number: 09/05/2019, 1:45 PM  Clinical Narrative:     Admitted from home with 24/7 caregivers in place. Dtr assists as needed. [employer] for Oxygen. Dtr requesting return home with hospice services and prefers [facility] ([facility]). Referral to [facility] with Tony who confirmed they are able to admit pt to their service today. Dtr aware and agreeable.               Expected Discharge Plan: Home w Hospice Care Barriers to Discharge: No Barriers Identified   Patient Goals and CMS Choice     Choice offered to / list presented to : Adult Children  Expected Discharge Plan and Services Expected Discharge Plan: [place] arrangements for the past 2 months: Single Family Home Expected Discharge Date: 09/05/19                                    Prior Living Arrangements/Services Living arrangements for the past 2 months: Single Family Home Lives with:: Self, Other (Comment)(24/7 caregivers)          Need for Family Participation in Patient Care: Yes (Comment) Care giver support system in place?: Yes (comment)      Activities of Daily Living Home Assistive Devices/Equipment: Walker (specify type) ADL Screening (condition at time of admission) Patient's cognitive ability adequate to safely complete daily activities?: No Is the patient deaf or have difficulty hearing?: Yes Does the patient have difficulty seeing, even when wearing glasses/contacts?: No Does the patient have difficulty concentrating, remembering, or making decisions?: Yes Patient able to express need for assistance with ADLs?: Yes Does the patient have difficulty dressing or bathing?: Yes Independently performs ADLs?: No Communication:  Independent Dressing (OT): Needs assistance Is this a change from baseline?: Pre-admission baseline Grooming: Needs assistance Is this a change from baseline?: Pre-admission baseline Feeding: Independent Bathing: Needs assistance Is this a change from baseline?: Pre-admission baseline Toileting: Needs assistance Is this a change from baseline?: Pre-admission baseline In/Out Bed: Needs assistance Is this a change from baseline?: Pre-admission baseline Walks in Home: Needs assistance Is this a change from baseline?: Pre-admission baseline Does the patient have difficulty walking or climbing stairs?: Yes Weakness of Legs: Both Weakness of Arms/Hands: None  Permission Sought/Granted                  Emotional Assessment       Orientation: : Fluctuating Orientation (Suspected and/or reported Sundowners)      Admission diagnosis:  Pneumonia due to COVID-19 virus [U07.1, J12.89] Patient Active Problem List   Diagnosis Date Noted  . Hospice care patient 09/05/2019  . Pneumonia due to COVID-19 virus 09/01/2019  . Chronic systolic (congestive) heart failure ([place]) 09/01/2019  . Respiratory distress, acute 08/31/2019  . Gross hematuria   . History of prostate cancer   . Kidney stones   . Sepsis ([place]) 10/03/2018  . CAP (community acquired pneumonia) 10/03/2018  . HTN (hypertension) 10/03/2018  . HLD (hyperlipidemia) 10/03/2018  . COPD (chronic obstructive pulmonary disease) ([place]) 10/03/2018  . CAD (coronary artery disease) 10/03/2018  . Acute respiratory failure with hypoxia ([place]) 10/03/2018   PCP:  [name], MD  Pharmacy:   [facility] [address]), [place] [address] [place]) [place] [address] Phone: [phone] Fax: [phone]     Social Determinants of Health (SDOH) Interventions    Readmission Risk Interventions No flowsheet data found.

## 2019-09-05 NOTE — Discharge Planning (Addendum)
Spoke with daughter to update on current POC and status.  Daughter is hoping to get Dad home today to die (Home with Hospice).  She requested to speak with SW STAT, but I informed her I'd be happy to contact and inform so they would hopefully call within the next few hours.  SW contacted of request.  Also, regarding prior inpatienet fall, daughter asked to speak with upper management.  Request forwarded to Janett Billow, RN Dietitian.)

## 2019-09-05 NOTE — TOC Transition Note (Signed)
Transition of Care [facility]) - CM/SW Discharge Note   Patient Details  Name: Alan Ortega MRN: [id number] Date of Birth: 24-Sep-1933  [facility]) CM/SW Contact:  Amador Cunas, LCSW Phone Number: 09/05/2019, 1:54 PM   Clinical Narrative:   DC home with hospice services through [place] ([facility]). Dtr aware and Christy with hospice confirmed they will meet pt at home at 1430 today. Signing off at d/c.  Wandra Feinstein, MSW, LCSW [phone] (GV coverage)      Final next level of care: Home w Hospice Care Barriers to Discharge: No Barriers Identified   Patient Goals and CMS Choice     Choice offered to / list presented to : Adult Children  Discharge Placement                Patient to be transferred to facility by: [facility] Name of family member notified: Dawnt Patient and family notified of of transfer: 09/05/19  Discharge Plan and Services                                     Social Determinants of Health (SDOH) Interventions     Readmission Risk Interventions No flowsheet data found.

## 2019-09-05 NOTE — Discharge Summary (Signed)
[facility]  Physician Discharge Summary   Patient ID: Alan Ortega MRN: [id number] DOB/AGE: 03/01/1934 85 y.o.  Admit date: 09/01/2019 Discharge date: 09/05/2019  PCP: [name], MD  DISCHARGE DIAGNOSES:  Acute intracranial hemorrhage secondary to fall Acute respiratory failure with hypoxia Pneumonia due to [id number] Acute metabolic encephalopathy in the setting of dementia Suspected urinary tract infection, present on admission History of COPD Chronic respiratory failure with hypoxia Chronic systolic CHF Coronary artery disease   RECOMMENDATIONS FOR OUTPATIENT FOLLOW UP: 1. Patient being discharged home with hospice services   Home Health: Hospice Equipment/Devices: None  CODE STATUS: DNR  DISCHARGE CONDITION: poor  Diet recommendation: Comfort feeds as tolerated  INITIAL HISTORY: 85 y.o.malewith a past medical history of dementia, coronary artery disease, history of COPD (unclear if he is on home oxygen or not), history of essential hypertension, chronic systolic CHF. Patient is a poor historian due to his dementia. Most of the history was obtained from the ER physician notes as well as the hospitalist H&P done at [facility]. Apparently patient had exposure to Covid 19 atend of November. He initially tested negative for the virus. He started having symptoms of viral infections and patient's primary care physician ordered Z-Pak and steroids. Subsequently patient started having fever and chills. Started feeling a little short of breath. He was also noted to be more confused at home. He was noted to be hypoxic. He was noted to be positive for COVID-19 in the emergency department. Chest x-ray was also suggestive of bilateral opacities. He was subsequently hospitalized for further management. He was transferred to [facility].   HOSPITAL COURSE:   Acute Hypoxic Resp. Failure/Pneumonia due to COVID-19 Patient  was hospitalized.  He was started on remdesivir and steroids.  Not given Actemra or convalescent plasma as his inflammatory markers were not that significantly elevated.  CT angiogram done at the time of admission did not show any PE.  However due to his fall in the hospital resulting in acute intracranial bleeding he was transitioned to comfort care.  Acute intracranial hemorrhage secondary to fall On the evening of 12/12 patient went over the railing of his bed and dropped to the floor.  He was noted to have bruising on the left side of his face.  A CT scan was done which showed cranial hemorrhage with subdural hematoma as well as subarachnoid hemorrhage.  CT scan was repeated after few hours which showed worsening hemorrhage.  Discussions held with family member.  Patient transition to comfort care.  Abnormal UA/suspected UTI UA was noted to be abnormal.  Due to his dementia it was difficult to ascertain if the patient was symptomatic or not.  His procalcitonin was minimally elevated at 0.34.  Patient started on ceftriaxone.  Now comfort care.  Acute metabolic encephalopathy in the setting of dementia Patient was pleasantly confused for the most part.  He did have episodes of agitation for which he was started on Seroquel.  Unfortunately he climbed over the railings of the bed and fell.  See above.  History of COPD/Chronic respiratory failure with hypoxia Patient uses oxygen at home at 2-1/2 L/min.   Chronic systolic CHF Based on echocardiogram done in January 2020 his EF is 30 to 35%.  He was given intravenous Lasix while he was in [facility].     Coronary artery disease status post CABG Stable.  Continue aspirin beta-blocker.  Hypokalemia Was repleted  Acute Kidney injury Creatinine was 1.2 earlier this year.  Creatinine  was 1.46 at admission.  Improved to 1.27.  Monitor urine output.  Likely has some element of chronic kidney disease, unknown stage.    Patient's daughter wishes for  him to come home with hospice.  This has been arranged.  He will be discharged later today.  Comfort medications including morphine, Ativan, glycopyrrolate has been sent to his pharmacy.   PERTINENT LABS:  The results of significant diagnostics from this hospitalization (including imaging, microbiology, ancillary and laboratory) are listed below for reference.    Microbiology: Recent Results (from the past 240 hour(s))  Novel Coronavirus, NAA ([facility])     Status: None   Collection Time: 08/26/19  1:05 PM   Specimen: Nasopharyngeal(NP) swabs in vial transport medium   NASOPHARYNGE  TESTING  Result Value Ref Range Status   SARS-CoV-2, NAA Not Detected Not Detected Final    Comment: This nucleic acid amplification test was developed and its performance characteristics determined by [employer]. Nucleic acid amplification tests include PCR and TMA. This test has not been FDA cleared or approved. This test has been authorized by FDA under an Emergency Use Authorization (EUA). This test is only authorized for the duration of time the declaration that circumstances exist justifying the authorization of the emergency use of in vitro diagnostic tests for detection of SARS-CoV-2 virus and/or diagnosis of COVID-19 infection under section 564(b)(1) of the Act, 21 U.S.C. [id number]) (1), unless the authorization is terminated or revoked sooner. When diagnostic testing is negative, the possibility of a false negative result should be considered in the context of a patient's recent exposures and the presence of clinical signs and symptoms consistent with COVID-19. An individual without symptoms of COVID-19 and who is not shedding SARS-CoV-2 virus would  expect to have a negative (not detected) result in this assay.   Blood culture (routine x 2)     Status: None   Collection Time: 08/31/19  1:44 PM   Specimen: BLOOD  Result Value Ref Range Status   Specimen Description BLOOD BLOOD LEFT  FOREARM  Final   Special Requests   Final    BOTTLES DRAWN AEROBIC AND ANAEROBIC Blood Culture adequate volume   Culture   Final    NO GROWTH 5 DAYS Performed at [facility], [address] [place], [place] [address]    Report Status 09/05/2019 FINAL  Final  Blood culture (routine x 2)     Status: None   Collection Time: 08/31/19  2:26 PM   Specimen: BLOOD  Result Value Ref Range Status   Specimen Description BLOOD BLOOD RIGHT HAND  Final   Special Requests   Final    BOTTLES DRAWN AEROBIC AND ANAEROBIC Blood Culture adequate volume   Culture   Final    NO GROWTH 5 DAYS Performed at [facility], [address], [place], [place] [address]    Report Status 09/05/2019 FINAL  Final  SARS CORONAVIRUS 2 (TAT 6-24 HRS) Nasopharyngeal Nasopharyngeal Swab     Status: Abnormal   Collection Time: 08/31/19  2:26 PM   Specimen: Nasopharyngeal Swab  Result Value Ref Range Status   SARS Coronavirus 2 POSITIVE (A) NEGATIVE Final    Comment: RESULT CALLED TO, READ BACK BY AND VERIFIED WITH: Juliane Lack RN 9:35 09/01/19 ([email or web address]) (NOTE) SARS-CoV-2 target nucleic acids are DETECTED. The SARS-CoV-2 RNA is generally detectable in upper and lower respiratory specimens during the acute phase of infection. Positive results are indicative of the presence of SARS-CoV-2 RNA. Clinical correlation with patient history  and other diagnostic information is  necessary to determine patient infection status. Positive results do not rule out bacterial infection or co-infection with other viruses.  The expected result is Negative. Fact Sheet for Patients: SugarRoll.be Fact Sheet for Healthcare Providers: https://www.woods-mathews.com/ This test is not yet approved or cleared by the [place] FDA and  has been authorized for detection and/or diagnosis of SARS-CoV-2 by FDA under an Emergency Use Authorization (EUA). This EUA will remain  in effect  (meaning this test can be used) for the  duration of the COVID-19 declaration under Section 564(b)(1) of the Act, 21 U.S.C. section 360bbb-3(b)(1), unless the authorization is terminated or revoked sooner. Performed at [facility], [place] [address], [place], [place] [address]      Labs:  COVID-19 Labs  Recent Labs    09/03/19 0116  DDIMER 3.42*  FERRITIN 393*  CRP 6.1*    Lab Results  Component Value Date   SARSCOV2NAA POSITIVE (A) 08/31/2019   [place] Not Detected 08/26/2019      Basic Metabolic Panel: Recent Labs  Lab 08/31/19 1344 09/01/19 1935 09/03/19 0116  NA 142 143 142  K 2.8* 4.0 4.1  CL 104 102 106  CO2 28 31 26   GLUCOSE 94 109* 150*  BUN 25* 30* 46*  CREATININE 1.46* 1.44* 1.27*  CALCIUM 7.7* 7.9* 8.2*  MG  --  2.0  --    Liver Function Tests: Recent Labs  Lab 08/31/19 1344 09/01/19 1935 09/03/19 0116  AST 16 17 17   ALT 11 15 17   ALKPHOS 52 51 46  BILITOT 1.1 0.4 0.7  PROT 6.8 6.8 6.2*  ALBUMIN 3.2* 3.1* 2.8*   CBC: Recent Labs  Lab 08/31/19 1344 09/01/19 1935 09/03/19 0116  WBC 13.3* 12.0* 10.4  NEUTROABS 11.1* 9.2*  --   HGB 13.9 13.9 13.1  HCT 42.2 43.7 41.9  MCV 97.5 101.6* 101.0*  PLT 217 201 193     IMAGING STUDIES CT HEAD WO CONTRAST  Result Date: 09/04/2019 CLINICAL DATA:  Head trauma. Follow-up of subdural hematoma. EXAM: CT HEAD WITHOUT CONTRAST TECHNIQUE: Contiguous axial images were obtained from the base of the skull through the vertex without intravenous contrast. COMPARISON:  Head CT 09/03/2019 at 8:32 p.m. FINDINGS: Brain: Increased size of intraparenchymal hematoma within the anterior right temporal lobe, now measuring 1.9 x 1.8 cm, previously 0.8 x 0.7 cm. Surrounding subarachnoid blood is unchanged. The right convexity subdural hematoma is unchanged in thickness again measuring 5 mm. The amount of subarachnoid blood within the basal cisterns has increased. Small amount of pneumocephalus along the right  convexity is unchanged. 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 No midline shift. Unchanged size and configuration of the ventricles. Vascular: No abnormal hyperdensity of the major intracranial arteries or dural venous sinuses. No intracranial atherosclerosis. Skull: The visualized skull base, calvarium and extracranial soft tissues are normal. Sinuses/Orbits: No fluid levels or advanced mucosal thickening of the visualized paranasal sinuses. No mastoid or middle ear effusion. The orbits are normal. IMPRESSION: 1. Increased size of intraparenchymal hematoma within the anterior right temporal lobe, now measuring 1.9 x 1.8 cm, previously 0.8 x 0.7 cm. 2. Unchanged size of right convexity subdural hematoma. 3. Increased subarachnoid blood within the basal cisterns. These results will be called to the ordering clinician or representative by the Radiologist Assistant, and communication documented in the PACS or zVision Dashboard. Electronically Signed   By: Ulyses Jarred M.D.   On: 09/04/2019 02:13  CT HEAD WO CONTRAST  Result Date: 09/03/2019 CLINICAL DATA:  Unwitnessed fall. Head trauma. EXAM: CT HEAD WITHOUT CONTRAST TECHNIQUE: Contiguous axial images were obtained from the base of the skull through the vertex without intravenous contrast. COMPARISON:  CT head 04/28/2018 FINDINGS: Brain: Right-sided subdural hematoma measuring approximately 5 mm in thickness in the temporal lobe. Associated gas in the subdural space. Subarachnoid hemorrhage also present on the right in the temporal lobe and extending into the basilar cistern on the right. Small amount of subdural hematoma along the tentorium on the right. Small amount of subarachnoid hemorrhage in the left sylvian fissure. Negative for midline shift. Moderate atrophy. Moderate chronic microvascular ischemic change in the white matter similar to the prior study. No acute infarct or mass Vascular: Negative for hyperdense vessel.  Atherosclerotic calcification. Skull: No displaced skull fracture. Subdural gas on the right is presumably related to a fracture through a sinus. Image quality degraded by motion. Sinuses/Orbits: Mastoid sinus clear bilaterally. Air-fluid level in the sphenoid sinus. Remaining paranasal sinuses clear Other: None IMPRESSION: 1. Right-sided subdural hematoma measuring approximately 5 mm in thickness in the temporal lobe. Subarachnoid hemorrhage in the right temporal lobe and extending into the basilar cistern on the right. Small amount of subdural hematoma along the tentorium on the right. Small amount of subarachnoid hemorrhage in the left Sylvian fissure. 2. Subdural gas on the right is presumably related to a fracture through a sinus. The right temporal bone would be most likely however the mastoid sinuses clear on the right. There is an air-fluid level in the sphenoid sinus however no skull base fracture identified. 3. Atrophy and chronic microvascular ischemia. 4. These results were called by telephone at the time of interpretation on 09/03/2019 at 9:29 pm to provider TRINITY VERA , who verbally acknowledged these results. Electronically Signed   By: Franchot Gallo M.D.   On: 09/03/2019 21:29   CT Angio Chest PE W and/or Wo Contrast  Result Date: 08/31/2019 CLINICAL DATA:  Hypoxia on home oxygen. More confused and normal. Recent COVID-19 exposure. Elevated D-dimer. Evaluate for pulmonary embolism. EXAM: CT ANGIOGRAPHY CHEST WITH CONTRAST TECHNIQUE: Multidetector CT imaging of the chest was performed using the standard protocol during bolus administration of intravenous contrast. Multiplanar CT image reconstructions and MIPs were obtained to evaluate the vascular anatomy. CONTRAST:  71mL OMNIPAQUE IOHEXOL 350 MG/ML SOLN COMPARISON:  09/13/2013 FINDINGS: Cardiovascular: Heart is normal in size. Calcified plaque is present over the left main and 3 vessel coronary arteries. There is mild calcified plaque over the  thoracic aorta. Thoracic aorta is normal caliber without dissection or aneurysm. Pulmonary arterial system is well opacified without evidence of emboli. Remaining vascular structures are unremarkable. Mediastinum/Nodes: 0.1 cm AP window lymph node reactive. No other significant mediastinal or hilar adenopathy. Remaining mediastinal structures are normal. Lungs/Pleura: Lungs are adequately inflated with moderate centrilobular emphysematous disease. There is mild posterior dependent bibasilar atelectasis left worse than right. No evidence of effusion. No lobar consolidation. Airways are unremarkable. Upper Abdomen: Calcified plaque over the abdominal aorta. There are a few small hypodensities over the liver unchanged likely cysts. Subtle cystic change over the upper pole left kidney unchanged. Stable 1.6 cm left adrenal nodule likely adenoma. Musculoskeletal: Degenerative change of the spine. Review of the MIP images confirms the above findings. IMPRESSION: 1. No evidence of pulmonary emboli. No acute cardiopulmonary disease. Minimal dependent bibasilar atelectasis. 2. Aortic Atherosclerosis (ICD10-I70.0) and Emphysema (ICD10-J43.9). Atherosclerotic coronary artery disease. 3. Few small liver hypodensities unchanged likely cysts. Minimal  cystic change over the upper pole left kidney unchanged. 1.6 cm left adrenal nodule unchanged likely an adenoma. Electronically Signed   By: Marin Olp M.D.   On: 08/31/2019 16:24   DG Chest Portable 1 View  Result Date: 08/31/2019 CLINICAL DATA:  Shortness of breath, altered mental status, hypoxia EXAM: PORTABLE CHEST 1 VIEW COMPARISON:  Portable exam 1346 hours compared to 10/08/2018 FINDINGS: Upper normal size of cardiac silhouette post CABG. Atherosclerotic calcification aorta. Mediastinal contours and pulmonary vascularity otherwise normal. Emphysematous and minimal bronchitic consistent with COPD. Bibasilar interstitial infiltrates, chronic unchanged since 10/08/2018.  Upper lungs clear. No pleural effusion or pneumothorax. IMPRESSION: Borderline enlargement of cardiac silhouette post CABG. COPD changes with bibasilar interstitial infiltrates; these could represent chronic interstitial disease or recurrent bibasilar pneumonia. Electronically Signed   By: Lavonia Dana M.D.   On: 08/31/2019 14:15    DISCHARGE EXAMINATION: Vitals:   09/04/19 1723 09/05/19 0020 09/05/19 0841 09/05/19 1046  BP: 120/80 (!) 154/79    Pulse: 74 83    Resp:  15    Temp: 97.7 F (36.5 C) 98.5 F (36.9 C)    TempSrc: Axillary Axillary    SpO2: (!) 81% (!) 83% (!) 82% (!) 89%  Weight:      Height:       General appearance: Poorly responsive.  Encephalopathic. Resp: Worse breath sounds with crackles at the bases. Cardio: S1-S2 is normal regular.  No S3-S4.  No rubs murmurs or bruit GI: Abdomen is soft.  Nontender nondistended.  Bowel sounds are present normal.  No masses organomegaly   DISPOSITION: Home with hospice  Discharge Instructions    Discharge instructions   Complete by: As directed    COVID 19 INSTRUCTIONS  - You are felt to be stable enough to no longer require inpatient monitoring, testing, and treatment, though you will need to follow the recommendations below: - Based on the CDC's non-test criteria for ending self-isolation: You may not return to work/leave the home until at least 21 days since symptom onset AND 3 days without a fever (without taking tylenol, ibuprofen, etc.) AND have improvement in respiratory symptoms. - Do not take NSAID medications (including, but not limited to, ibuprofen, advil, motrin, naproxen, aleve, goody's powder, etc.) - Follow up with your doctor in the next week via telehealth or seek medical attention right away if your symptoms get WORSE.  - Consider donating plasma after you have recovered (either 14 days after a negative test or 28 days after symptoms have completely resolved) because your antibodies to this virus may be helpful  to give to others with life-threatening infections. Please go to the website www.[email or web address] if you would like to consider volunteering for plasma donation.    Directions for you at home:  Wear a facemask You should wear a facemask that covers your nose and mouth when you are in the same room with other people and when you visit a healthcare provider. People who live with or visit you should also wear a facemask while they are in the same room with you.  Separate yourself from other people in your home As much as possible, you should stay in a different room from other people in your home. Also, you should use a separate bathroom, if available.  Avoid sharing household items You should not share dishes, drinking glasses, cups, eating utensils, towels, bedding, or other items with other people in your home. After using these items, you should wash them thoroughly with soap and water.  Cover your coughs and sneezes Cover your mouth and nose with a tissue when you cough or sneeze, or you can cough or sneeze into your sleeve. Throw used tissues in a lined trash can, and immediately wash your hands with soap and water for at least 20 seconds or use an alcohol-based hand rub.  Wash your [facility] your hands often and thoroughly with soap and water for at least 20 seconds. You can use an alcohol-based hand sanitizer if soap and water are not available and if your hands are not visibly dirty. Avoid touching your eyes, nose, and mouth with unwashed hands.  Directions for those who live with, or provide care at home for you:  Limit the number of people who have contact with the patient If possible, have only one caregiver for the patient. Other household members should stay in another home or place of residence. If this is not possible, they should stay in another room, or be separated from the patient as much as possible. Use a separate bathroom, if available. Restrict visitors who do not  have an essential need to be in the home.  Ensure good ventilation Make sure that shared spaces in the home have good air flow, such as from an air conditioner or an opened window, weather permitting.  Wash your hands often Wash your hands often and thoroughly with soap and water for at least 20 seconds. You can use an alcohol based hand sanitizer if soap and water are not available and if your hands are not visibly dirty. Avoid touching your eyes, nose, and mouth with unwashed hands. Use disposable paper towels to dry your hands. If not available, use dedicated cloth towels and replace them when they become wet.  Wear a facemask and gloves Wear a disposable facemask at all times in the room and gloves when you touch or have contact with the patient's blood, body fluids, and/or secretions or excretions, such as sweat, saliva, sputum, nasal mucus, vomit, urine, or feces.  Ensure the mask fits over your nose and mouth tightly, and do not touch it during use. Throw out disposable facemasks and gloves after using them. Do not reuse. Wash your hands immediately after removing your facemask and gloves. If your personal clothing becomes contaminated, carefully remove clothing and launder. Wash your hands after handling contaminated clothing. Place all used disposable facemasks, gloves, and other waste in a lined container before disposing them with other household waste. Remove gloves and wash your hands immediately after handling these items.  Do not share dishes, glasses, or other household items with the patient Avoid sharing household items. You should not share dishes, drinking glasses, cups, eating utensils, towels, bedding, or other items with a patient who is confirmed to have, or being evaluated for, COVID-19 infection. After the person uses these items, you should wash them thoroughly with soap and water.  Wash laundry thoroughly Immediately remove and wash clothes or bedding that have  blood, body fluids, and/or secretions or excretions, such as sweat, saliva, sputum, nasal mucus, vomit, urine, or feces, on them. Wear gloves when handling laundry from the patient. Read and follow directions on labels of laundry or clothing items and detergent. In general, wash and dry with the warmest temperatures recommended on the label.  Clean all areas the individual has used often Clean all touchable surfaces, such as counters, tabletops, doorknobs, bathroom fixtures, toilets, phones, keyboards, tablets, and bedside tables, every day. Also, clean any surfaces that may have blood, body fluids,  and/or secretions or excretions on them. Wear gloves when cleaning surfaces the patient has come in contact with. Use a diluted bleach solution (e.g., dilute bleach with 1 part bleach and 10 parts water) or a household disinfectant with a label that says EPA-registered for coronaviruses. To make a bleach solution at home, add 1 tablespoon of bleach to 1 quart (4 cups) of water. For a larger supply, add  cup of bleach to 1 gallon (16 cups) of water. Read labels of cleaning products and follow recommendations provided on product labels. Labels contain instructions for safe and effective use of the cleaning product including precautions you should take when applying the product, such as wearing gloves or eye protection and making sure you have good ventilation during use of the product. Remove gloves and wash hands immediately after cleaning.  Monitor yourself for signs and symptoms of illness Caregivers and household members are considered close contacts, should monitor their health, and will be asked to limit movement outside of the home to the extent possible. Follow the monitoring steps for close contacts listed on the symptom monitoring form.   If you have additional questions, contact your local health department or call the epidemiologist on call at [phone] (available 24/7). This guidance is  subject to change. For the most up-to-date guidance from [facility], please refer to their website: YouBlogs.pl   You were cared for by a hospitalist during your hospital stay. If you have any questions about your discharge medications or the care you received while you were in the hospital after you are discharged, you can call the unit and asked to speak with the hospitalist on call if the hospitalist that took care of you is not available. Once you are discharged, your primary care physician will handle any further medical issues. Please note that NO REFILLS for any discharge medications will be authorized once you are discharged, as it is imperative that you return to your primary care physician (or establish a relationship with a primary care physician if you do not have one) for your aftercare needs so that they can reassess your need for medications and monitor your lab values. If you do not have a primary care physician, you can call [phone] for a physician referral.   Increase activity slowly   Complete by: As directed         Allergies as of 09/05/2019   No Known Allergies     Medication List    STOP taking these medications   albuterol 108 (90 Base) MCG/ACT inhaler Commonly known as: VENTOLIN HFA   aspirin EC 81 MG tablet   budesonide 0.25 MG/2ML nebulizer solution Commonly known as: PULMICORT   carvedilol 3.125 MG tablet Commonly known as: COREG   escitalopram 10 MG tablet Commonly known as: LEXAPRO   furosemide 40 MG tablet Commonly known as: LASIX   gabapentin 100 MG capsule Commonly known as: NEURONTIN   ipratropium-albuterol 0.5-2.5 (3) MG/3ML Soln Commonly known as: DUONEB   metoprolol succinate 25 MG 24 hr tablet Commonly known as: TOPROL-XL   nystatin powder Commonly known as: MYCOSTATIN/NYSTOP   predniSONE 20 MG tablet Commonly known as: DELTASONE   simvastatin 20 MG tablet Commonly known  as: ZOCOR   tiotropium 18 MCG inhalation capsule Commonly known as: Spiriva HandiHaler     TAKE these medications   glycopyrrolate 1 MG tablet Commonly known as: ROBINUL Take 1 tablet (1 mg total) by mouth every 4 (four) hours as needed (excessive secretions).   haloperidol 2  MG/ML solution Commonly known as: HALDOL Place 1 mL (2 mg total) under the tongue every 4 (four) hours as needed for agitation (or delirium).   LORazepam 2 MG/ML concentrated solution Commonly known as: ATIVAN Place 0.5 mLs (1 mg total) under the tongue every 4 (four) hours as needed for anxiety.   morphine 20 MG/ML concentrated solution Commonly known as: ROXANOL Take 0.25 mLs (5 mg total) by mouth every 2 (two) hours as needed for up to 7 days for severe pain or shortness of breath.        Follow-up Information    [name], MD. Call.   Specialty: Internal Medicine Why: as needed Contact information: [place] [address] [phone]           TOTAL DISCHARGE TIME: 35 minutes  [facility] Pager on [email or web address]  09/05/2019, 12:16 PM

## 2019-09-05 NOTE — Care Management Important Message (Signed)
Important Message  Patient Details  Name: Alan Ortega MRN: [id number] Date of Birth: 1934-04-24   Medicare Important Message Given:  Yes - Important Message mailed due to current National Emergency  Verbal consent obtained due to current National Emergency  Relationship to patient: Child Contact Name: Seabron, Dillin Call Date: 09/05/19  Time: 1314 Phone: [phone] Outcome: Spoke with contact Important Message mailed to: Patient address on file    [place] 09/05/2019, 1:14 PM

## 2019-09-05 NOTE — Progress Notes (Signed)
Call to daughter (dawn) to update on father's status. Patient remains free from signs of discomfort. Daughter continues to stress keeping patient dry and warm as important to her. Staff reassured daughter. Incontinent care given and condom cath placed, warm blankets provided. Pt minimally verball with incomprehensible reponse and movement of extremities. Turned and positioned every 2 hours.

## 2019-09-05 NOTE — Discharge Planning (Signed)
Called Daughter to inform of leaving the hospital with [facility], per her request.

## 2019-09-06 NOTE — Progress Notes (Signed)
Late Entry: Attempted X 2 to call patient's daughter. Pt had already been tx back home. Called at 15:11 and left my phone number.

## 2019-09-08 NOTE — Progress Notes (Signed)
No call back or message received from doctor pertaining to patients increased agitation/confusion. Frequently monitoring patient and encouraging him to stay in bed. All four guardrails up and bed alarm on.

## 2019-09-08 NOTE — Progress Notes (Signed)
Placed message to MD sent via secure chat in regards to patients increased confusion and agitation. Requested orders for anxiety/sedative medications.

## 2019-09-23 DEATH — deceased

## 2019-11-16 ENCOUNTER — Other Ambulatory Visit: Payer: Self-pay | Admitting: Internal Medicine

## 2020-10-02 IMAGING — CT CT HEAD W/O CM
1 of 2 series · 15 of 30 positions shown, 19 images · non-contrast
Comparison: CT head 04/28/2018

CLINICAL DATA: Unwitnessed fall. Head trauma.

EXAM:
CT HEAD WITHOUT CONTRAST
TECHNIQUE: Contiguous axial images were obtained from the base of the skull
through the vertex without intravenous contrast.

[Series 5: head w/o st · axial · non-contrast · 0.42mm/px · z∈[-172,-28]mm · 15 of 106 slices shown, 19 images]
[im 5/106  brain]
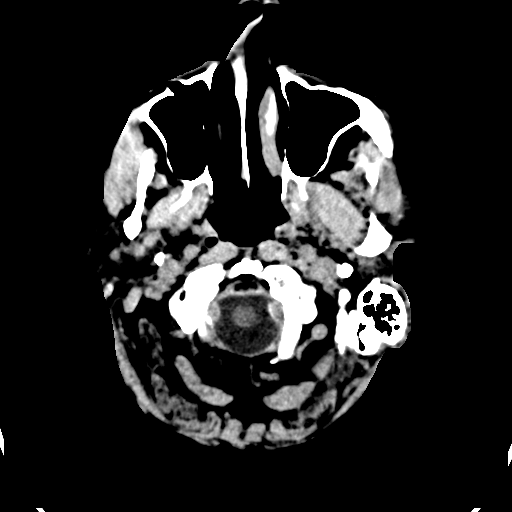
[im 5/106  bone]
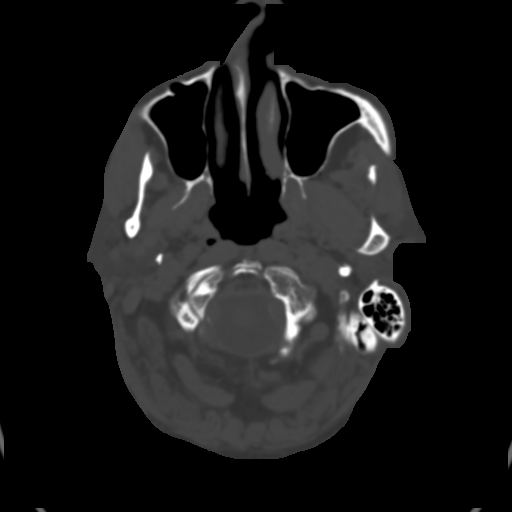
[im 15/106  brain]
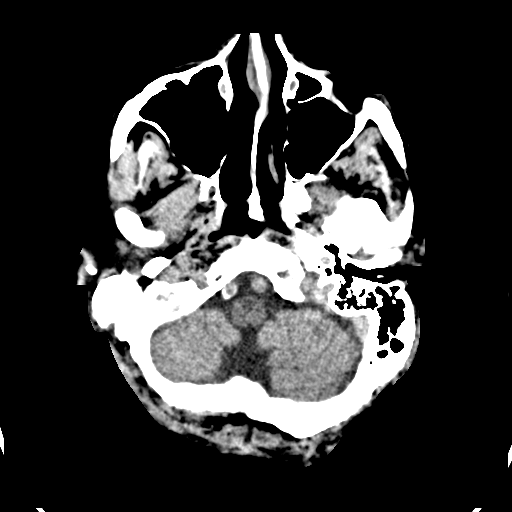
[im 20/106  brain]
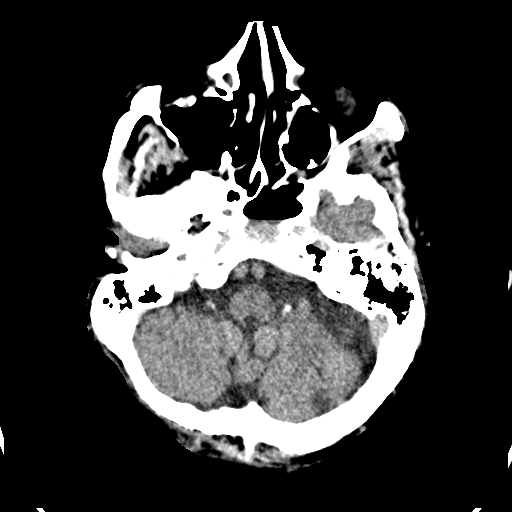
[im 24/106  brain]
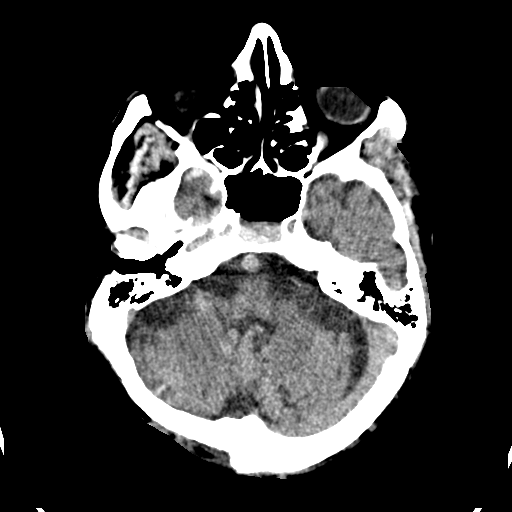
[im 34/106  brain]
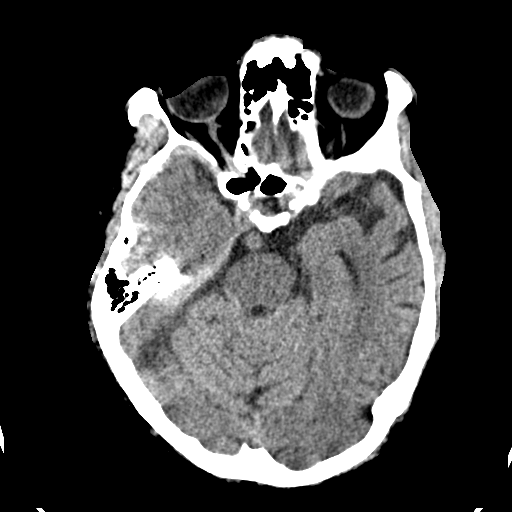
[im 34/106  bone]
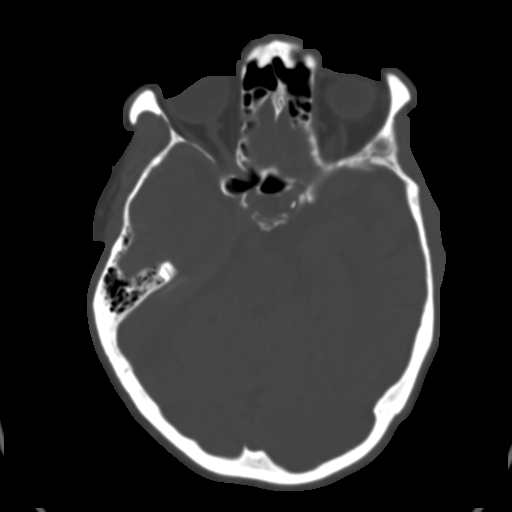
[im 39/106  brain]
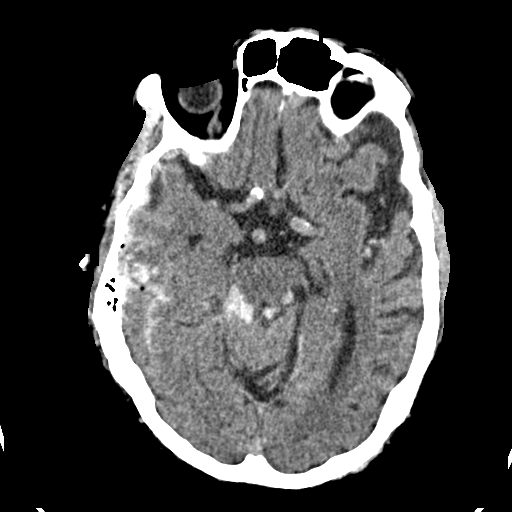
[im 48/106  brain]
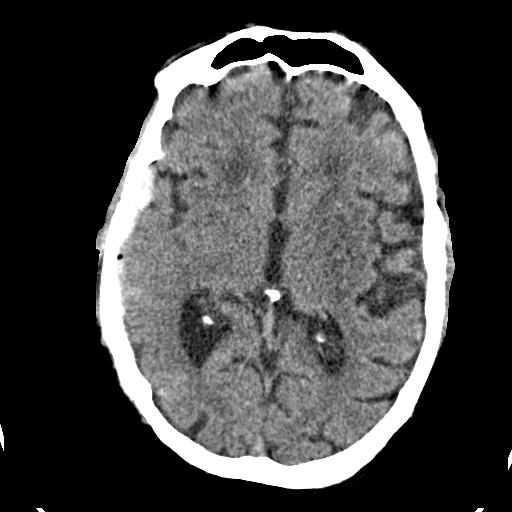
[im 53/106  brain]
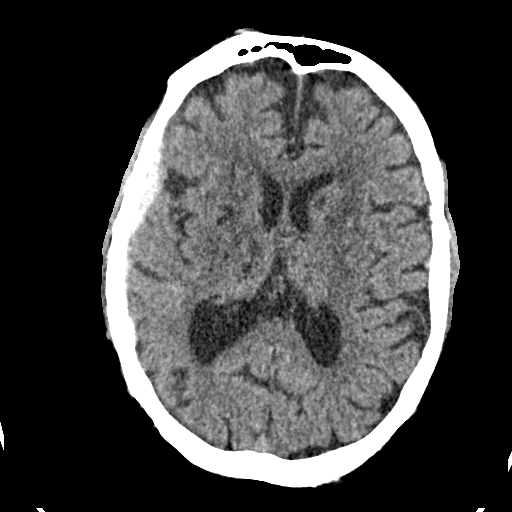
[im 58/106  brain]
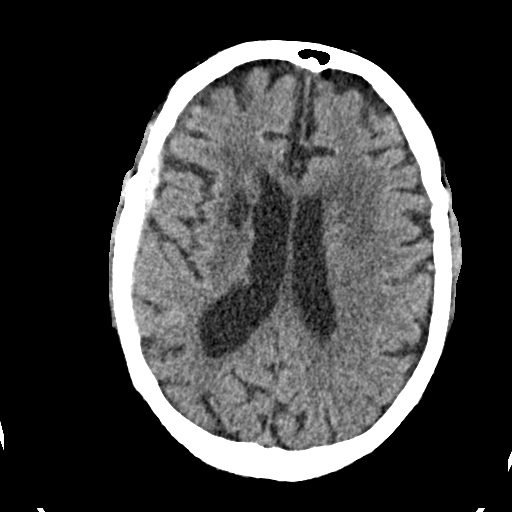
[im 58/106  bone]
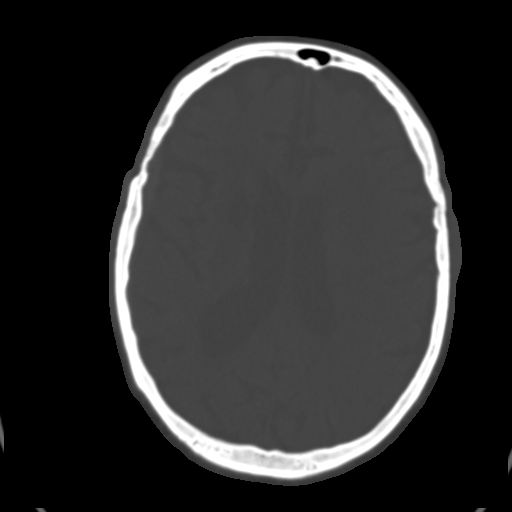
[im 67/106  brain]
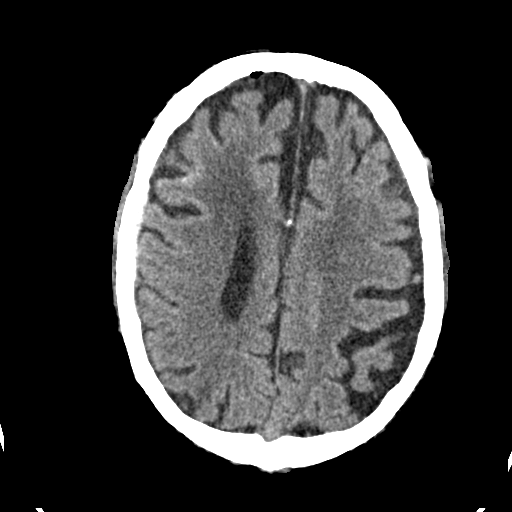
[im 72/106  brain]
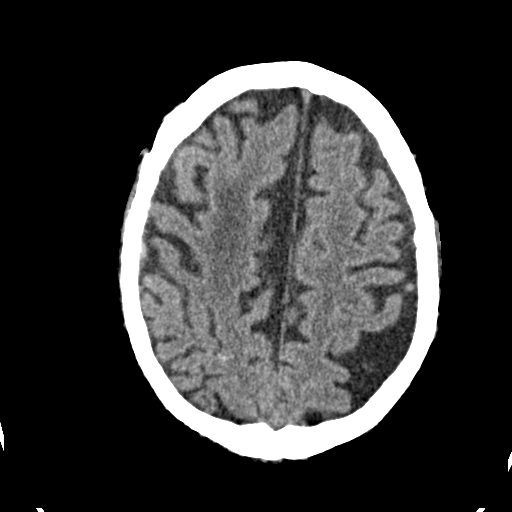
[im 82/106  brain]
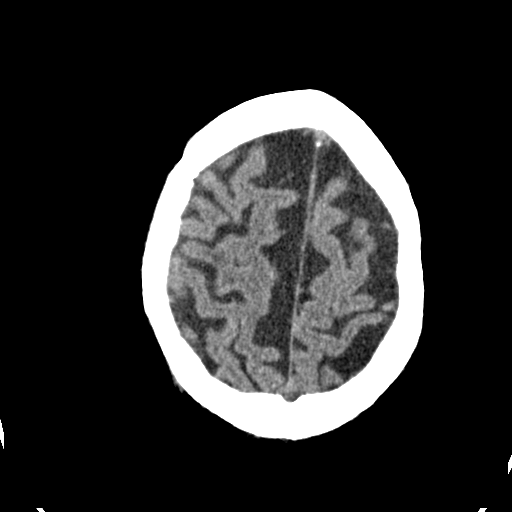
[im 86/106  brain]
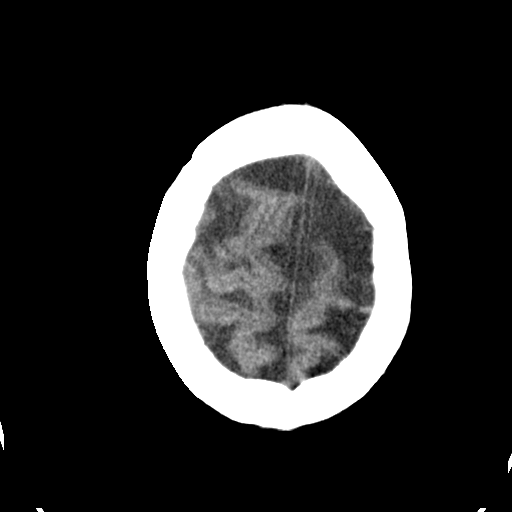
[im 86/106  bone]
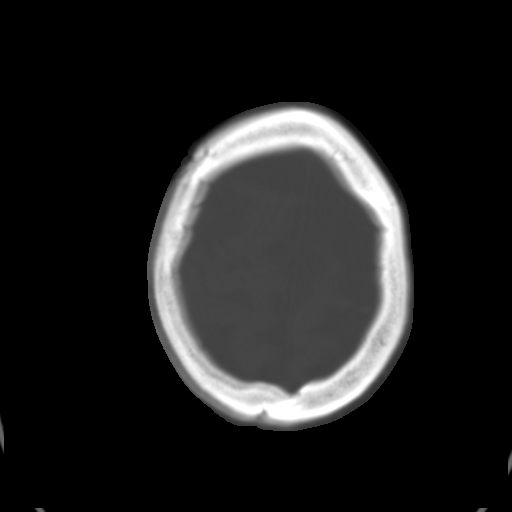
[im 91/106  brain]
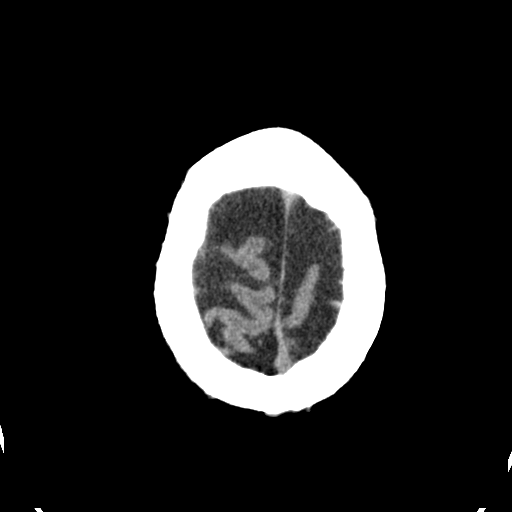
[im 101/106  brain]
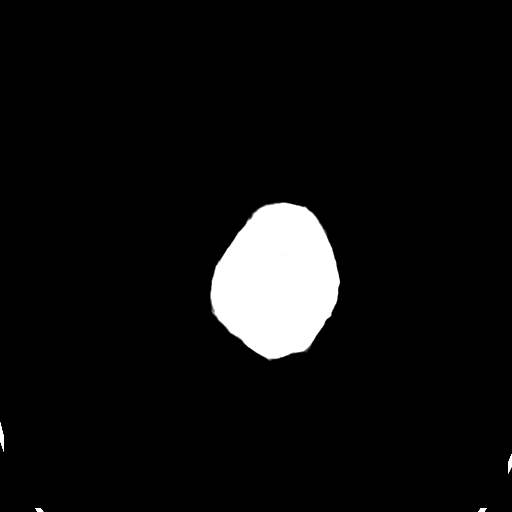

[15 of 30 positions shown; findings below may reference images not displayed]

FINDINGS: Brain: Right-sided subdural hematoma measuring approximately 5 mm in
thickness in the temporal lobe. Associated gas in the subdural
space. Subarachnoid hemorrhage also present on the right in the
temporal lobe and extending into the basilar cistern on the right.
Small amount of subdural hematoma along the tentorium on the right.
Small amount of subarachnoid hemorrhage in the left sylvian fissure.
Negative for midline shift.

Moderate atrophy. Moderate chronic microvascular ischemic change in
the white matter similar to the prior study. No acute infarct or
mass

Vascular: Negative for hyperdense vessel. Atherosclerotic
calcification.

Skull: No displaced skull fracture. Subdural gas on the right is
presumably related to a fracture through a sinus. Image quality
degraded by motion.

Sinuses/Orbits: Mastoid sinus clear bilaterally. Air-fluid level in
the sphenoid sinus. Remaining paranasal sinuses clear

Other: None
IMPRESSION: 1. Right-sided subdural hematoma measuring approximately 5 mm in
thickness in the temporal lobe. Subarachnoid hemorrhage in the right
temporal lobe and extending into the basilar cistern on the right.
Small amount of subdural hematoma along the tentorium on the right.
Small amount of subarachnoid hemorrhage in the left Sylvian fissure.
2. Subdural gas on the right is presumably related to a fracture
through a sinus. The right temporal bone would be most likely
however the mastoid sinuses clear on the right. There is an
air-fluid level in the sphenoid sinus however no skull base fracture
identified.
3. Atrophy and chronic microvascular ischemia.
4. These results were called by telephone at the time of
interpretation on 09/03/2019 at [DATE] to provider SARELLA REINA ,
who verbally acknowledged these results.
